# Patient Record
Sex: Female | Born: 1945
Health system: Southern US, Community
[De-identification: ages and names within clinical notes are randomized; demographics above are authoritative.]

## PROBLEM LIST (undated history)

## (undated) DIAGNOSIS — Z86010 Personal history of colonic polyps: Secondary | ICD-10-CM

## (undated) DIAGNOSIS — T7840XA Allergy, unspecified, initial encounter: Secondary | ICD-10-CM

## (undated) DIAGNOSIS — M7918 Myalgia, other site: Secondary | ICD-10-CM

## (undated) DIAGNOSIS — J300 Vasomotor rhinitis: Secondary | ICD-10-CM

## (undated) DIAGNOSIS — E669 Obesity, unspecified: Secondary | ICD-10-CM

## (undated) DIAGNOSIS — R0789 Other chest pain: Secondary | ICD-10-CM

## (undated) DIAGNOSIS — I10 Essential (primary) hypertension: Secondary | ICD-10-CM

## (undated) DIAGNOSIS — E785 Hyperlipidemia, unspecified: Secondary | ICD-10-CM

## (undated) HISTORY — DX: Vasomotor rhinitis: J30.0

## (undated) HISTORY — DX: Allergy, unspecified, initial encounter: T78.40XA

## (undated) HISTORY — DX: Other chest pain: R07.89

## (undated) HISTORY — DX: Hyperlipidemia, unspecified: E78.5

## (undated) HISTORY — DX: Essential (primary) hypertension: I10

## (undated) HISTORY — DX: Personal history of colonic polyps: Z86.010

## (undated) HISTORY — DX: Myalgia, other site: M79.18

## (undated) HISTORY — DX: Obesity, unspecified: E66.9

---

## 1992-08-20 HISTORY — PX: ABDOMINAL HYSTERECTOMY: SHX81

## 2000-02-02 ENCOUNTER — Other Ambulatory Visit: Admission: RE | Admit: 2000-02-02 | Discharge: 2000-02-02 | Payer: Self-pay | Admitting: *Deleted

## 2000-08-20 HISTORY — PX: CHOLECYSTECTOMY: SHX55

## 2000-08-20 HISTORY — PX: GANGLION CYST EXCISION: SHX1691

## 2000-12-30 ENCOUNTER — Other Ambulatory Visit: Admission: RE | Admit: 2000-12-30 | Discharge: 2000-12-30 | Payer: Self-pay | Admitting: *Deleted

## 2001-01-07 ENCOUNTER — Encounter: Payer: Self-pay | Admitting: *Deleted

## 2001-01-07 ENCOUNTER — Encounter: Admission: RE | Admit: 2001-01-07 | Discharge: 2001-01-07 | Payer: Self-pay | Admitting: *Deleted

## 2001-03-13 ENCOUNTER — Observation Stay (HOSPITAL_COMMUNITY): Admission: RE | Admit: 2001-03-13 | Discharge: 2001-03-13 | Payer: Self-pay | Admitting: General Surgery

## 2001-03-13 ENCOUNTER — Encounter (INDEPENDENT_AMBULATORY_CARE_PROVIDER_SITE_OTHER): Payer: Self-pay | Admitting: Specialist

## 2002-04-29 ENCOUNTER — Encounter: Payer: Self-pay | Admitting: Family Medicine

## 2002-04-29 ENCOUNTER — Encounter: Admission: RE | Admit: 2002-04-29 | Discharge: 2002-04-29 | Payer: Self-pay | Admitting: Family Medicine

## 2002-05-19 ENCOUNTER — Ambulatory Visit (HOSPITAL_COMMUNITY): Admission: RE | Admit: 2002-05-19 | Discharge: 2002-05-19 | Payer: Self-pay

## 2004-03-08 ENCOUNTER — Encounter: Admission: RE | Admit: 2004-03-08 | Discharge: 2004-03-08 | Payer: Self-pay | Admitting: Family Medicine

## 2007-08-21 DIAGNOSIS — Z860100 Personal history of colon polyps, unspecified: Secondary | ICD-10-CM

## 2007-08-21 HISTORY — DX: Personal history of colonic polyps: Z86.010

## 2007-08-21 HISTORY — DX: Personal history of colon polyps, unspecified: Z86.0100

## 2007-12-17 ENCOUNTER — Ambulatory Visit: Payer: Self-pay | Admitting: Internal Medicine

## 2008-01-20 ENCOUNTER — Ambulatory Visit: Payer: Self-pay | Admitting: Internal Medicine

## 2008-01-20 ENCOUNTER — Encounter: Payer: Self-pay | Admitting: Internal Medicine

## 2008-01-21 ENCOUNTER — Encounter: Payer: Self-pay | Admitting: Internal Medicine

## 2011-01-05 NOTE — Op Note (Signed)
[facility]  Patient:    Shelby Flores, Shelby Flores                           MRN: [id number]. Date: 03/13/01 Attending:  Sheppard Plumber. Earlene Plater, M.D. CC:         Heather Roberts, M.D.   Operative Report  PREOPERATIVE DIAGNOSIS:  Cholecystolithiasis.  POSTOPERATIVE DIAGNOSIS:  Cholecystolithiasis.  OPERATION:  Laparoscopic cholecystectomy.  SURGEON:  Timothy E. Earlene Plater, M.D.  ASSISTANT:  Rose Phi. Maple Hudson, M.D.  ANESTHESIA:  CRNA supervised by Dr. Rica Mast.  INDICATIONS:  [name] is healthy, 67, overweight, has had increasing problems with right upper quadrant pain, particularly at night.  Laboratory data are all normal except for cholesterol triglycerides, and ultrasound of the gallbladder shows a questions of stones, small, and multiple polyps.  She has been carefully counseled and wishes to proceed with this surgery.  DESCRIPTION OF PROCEDURE:  The patient was brought to the operating room and placed supine.  General endotracheal anesthesia administered.  The abdomen was scrubbed, prepped and draped in the usual fashion.  Marcaine 0.5% with epinephrine was used prior to each incision.  A vertical midline infraumbilical incision made, the fascia identified, opened vertically. Peritoneum entered without complication.  The Hasson catheter was placed and tied in place with a #1 Vicryl and the abdomen insufflated.  The camera introduced.  Peritoneoscopy revealed some adhesions in the lower midline but nothing pathologic.  A second 10 mm trocar placed in the mid epigastrium, two 5 mm trocars in the right upper quadrant.  The gallbladder was chronically inflamed, thickened, had adhesions of the omentum and duodenum.  The gallbladder was grasped and placed on tension, and the adhesions were taken down bluntly.  Careful dissection at the base of the gallbladder revealed a clearly defined cystic duct entering the ampulla of the gallbladder.  This was dissected around and  about for clear vision.  The cystic duct was triply clipped and divided.  Behind that, a small cystic artery was identified, isolated, and triply clipped.  The gallbladder was then removed from the gallbladder bed without incident or complication.  There was no bile leak and no bleeding.  Irrigation was carried out until clear.  The gallbladder was removed through the infraumbilical incision which was then tied shut.  The gallbladder was palpated and was thickened and did contain stones.  Copious irrigation was carried out.  It was clear.  All irrigants, CO2, trocars, and instruments were removed from the abdomen.  Each incision was examined, and then the two midline incisions were closed with interrupted 3-0 Monocryl.  Steri-Strips applied to all punctures.  Counts correct, dry sterile dressings applied, and she was removed to the recovery room in good condition. DD:  03/13/01 TD:  03/13/01 Job: [id number]

## 2011-09-28 ENCOUNTER — Other Ambulatory Visit: Payer: Self-pay | Admitting: Physician Assistant

## 2011-12-25 ENCOUNTER — Ambulatory Visit (INDEPENDENT_AMBULATORY_CARE_PROVIDER_SITE_OTHER): Payer: PRIVATE HEALTH INSURANCE | Admitting: Physician Assistant

## 2011-12-25 ENCOUNTER — Encounter: Payer: Self-pay | Admitting: Physician Assistant

## 2011-12-25 VITALS — BP 137/82 | HR 72 | Temp 98.6°F | Resp 16 | Ht 63.5 in | Wt 209.8 lb

## 2011-12-25 DIAGNOSIS — K219 Gastro-esophageal reflux disease without esophagitis: Secondary | ICD-10-CM

## 2011-12-25 DIAGNOSIS — E785 Hyperlipidemia, unspecified: Secondary | ICD-10-CM | POA: Insufficient documentation

## 2011-12-25 DIAGNOSIS — M7918 Myalgia, other site: Secondary | ICD-10-CM | POA: Insufficient documentation

## 2011-12-25 DIAGNOSIS — J300 Vasomotor rhinitis: Secondary | ICD-10-CM | POA: Insufficient documentation

## 2011-12-25 DIAGNOSIS — I10 Essential (primary) hypertension: Secondary | ICD-10-CM | POA: Insufficient documentation

## 2011-12-25 DIAGNOSIS — Z860100 Personal history of colon polyps, unspecified: Secondary | ICD-10-CM | POA: Insufficient documentation

## 2011-12-25 DIAGNOSIS — E669 Obesity, unspecified: Secondary | ICD-10-CM | POA: Insufficient documentation

## 2011-12-25 MED ORDER — BENAZEPRIL HCL 10 MG PO TABS: 10.0000 mg | ORAL_TABLET | Freq: Every day | ORAL | Status: DC

## 2011-12-25 MED ORDER — RANITIDINE HCL 150 MG PO TABS: 150.0000 mg | ORAL_TABLET | Freq: Two times a day (BID) | ORAL | Status: DC

## 2011-12-25 MED ORDER — SIMVASTATIN 40 MG PO TABS: 40.0000 mg | ORAL_TABLET | Freq: Every evening | ORAL | Status: DC

## 2011-12-25 NOTE — Patient Instructions (Signed)
Continue the meloxicam once or twice daily.  Do not supplement with Advil or Aleve.  Contact the oral surgeon you saw previously for a re-evaluation.

## 2011-12-25 NOTE — Progress Notes (Signed)
  Subjective:    Patient ID: Shelby Flores, female    DOB: 1945-10-27, 66 y.o.   MRN: [id number]  HPI  Presents for refills of medications and for evaluation of right sided upper abdominal pain.    She has a complete physical scheduled with me in a few weeks.  She describes the pain as burning, and is accompanied by increased belching.  Symptoms present x several months.  No nausea, vomiting.  No bowel changes.  No urinary changes.  Often present first thing in the morning when she awakens.  No identified link to meals.  Her facial pain continues.  She was followed in the faculty dental clinic at [facility], where the meloxicam was prescribed.  She has undergone nerve blocks and worn an oral appliance.  She has lost confidence in the provider she was seeing there. While she gets some benefit from the Meloxicam, she sometimes supplements it with OTC Aleve or Ibuprofen.  She is interested in seeking care with an oral surgeon she previously saw in [place] for a second opinion, who was recommended by the neurologist her mother sees for dystonia.  Review of Systems No chest pain, SOB, HA, dizziness, vision change, N/V, diarrhea, dysuria or rash.     Objective:   Physical Exam  Vital signs noted. Well-developed, well nourished WF who is awake, alert and oriented, in NAD. HEENT: [place]/AT, sclera and conjunctiva are clear.  EAC are patent, TMs are normal in appearance. Nasal mucosa is pink and moist. OP is clear. Neck: supple, non-tender, no lymphadenopathy, thyromegaly. Heart: RRR, no murmur Lungs: CTA Abdomen: normo-active bowel sounds, supple, no mass or organomegaly. Mild diffuse tenderness, worst in the RUQ. Skin: warm and dry without rash.       Assessment & Plan:   1. HTN (hypertension)  benazepril (LOTENSIN) 10 MG tablet  2. Hyperlipidemia  simvastatin (ZOCOR) 40 MG tablet  3. Reflux  ranitidine (ZANTAC) 150 MG tablet   She'll RTC in a few weeks for her CPE and we'll see how effective  the Ranitidine is.  She will stop supplemental NSAIDS, and advised to use acetaminophen if she needs something beyond the meloxicam.

## 2012-01-10 ENCOUNTER — Encounter: Payer: Self-pay | Admitting: Physician Assistant

## 2012-02-07 ENCOUNTER — Encounter: Payer: PRIVATE HEALTH INSURANCE | Admitting: Physician Assistant

## 2012-05-26 ENCOUNTER — Telehealth: Payer: Self-pay | Admitting: Radiology

## 2012-05-26 MED ORDER — MELOXICAM 7.5 MG PO TABS: 7.5000 mg | ORAL_TABLET | Freq: Every day | ORAL | Status: DC

## 2012-05-26 NOTE — Telephone Encounter (Signed)
rx and refills sent

## 2012-05-26 NOTE — Telephone Encounter (Signed)
LMOM RX sent in. 

## 2012-05-26 NOTE — Telephone Encounter (Signed)
Patient requesting Rx for Meloxicam 7.5 mg,she called pharmacy, they told her they  Have sent to [place], but we have not gotten,please advise on renewal [facility]

## 2012-08-21 ENCOUNTER — Encounter: Payer: Self-pay | Admitting: Internal Medicine

## 2012-08-25 ENCOUNTER — Other Ambulatory Visit: Payer: Self-pay | Admitting: Physician Assistant

## 2012-09-16 ENCOUNTER — Ambulatory Visit: Payer: PRIVATE HEALTH INSURANCE | Admitting: Family Medicine

## 2012-09-16 VITALS — BP 149/84 | HR 91 | Temp 98.6°F | Resp 17 | Ht 64.0 in | Wt 209.0 lb

## 2012-09-16 DIAGNOSIS — G500 Trigeminal neuralgia: Secondary | ICD-10-CM

## 2012-09-16 DIAGNOSIS — E785 Hyperlipidemia, unspecified: Secondary | ICD-10-CM

## 2012-09-16 DIAGNOSIS — Z23 Encounter for immunization: Secondary | ICD-10-CM

## 2012-09-16 MED ORDER — SIMVASTATIN 40 MG PO TABS: 40.0000 mg | ORAL_TABLET | Freq: Every evening | ORAL | Status: DC

## 2012-09-16 MED ORDER — BENAZEPRIL HCL 10 MG PO TABS: 10.0000 mg | ORAL_TABLET | Freq: Every day | ORAL | Status: DC

## 2012-09-16 MED ORDER — GABAPENTIN 300 MG PO CAPS: 300.0000 mg | ORAL_CAPSULE | Freq: Every day | ORAL | Status: DC

## 2012-09-16 NOTE — Progress Notes (Signed)
  Subjective:    Patient ID: Shelby Flores, female    DOB: [birth year], 67 y.o.   MRN: [id number]  HPI  Here for throbbing pain in the left ear for six weeks.  Has been shooting across the face.  Is going to a dental clinic so she was afraid to come in for ear pain.   Sometimes the pain will shoot down the jaw and occasionally it will shoot around the head.  Patient is taking Meloxicam for dental pain.  Mother has dystonia and has received botox injections for 20 years.  Patient has been wearing a splint at night for six weeks.  Will see Dr. Orlin Hilding in two weeks.  Thought it might be myofacial muscle pain.  Works for the [employer] as a Engineer, structural.    She has had chronic pain in pain.    Review of Systems     Objective:   Physical Exam HEENT:  Normal Neuro:  Normal CN      Assessment & Plan:   1. Trigeminal neuralgia  gabapentin (NEURONTIN) 300 MG capsule, benazepril (LOTENSIN) 10 MG tablet  2. Hyperlipidemia  simvastatin (ZOCOR) 40 MG tablet, Lipid panel, Comprehensive metabolic panel  3. Need for prophylactic vaccination and inoculation against influenza  Flu vaccine greater than or equal to 3yo preservative free IM

## 2012-09-16 NOTE — Patient Instructions (Signed)
Trigeminal Neuralgia Trigeminal neuralgia is a nerve disorder that causes sudden attacks of severe facial pain. It is caused by damage to the trigeminal nerve, a major nerve in the face. It is more common in women and in the elderly, although it can also happen in younger patients. Attacks last from a few seconds to several minutes and can occur from a couple of times per year to several times per day. Trigeminal neuralgia can be a very distressing and disabling condition. Surgery may be needed in very severe cases if medical treatment does not give relief. HOME CARE INSTRUCTIONS   If your caregiver prescribed medication to help prevent attacks, take as directed.  To help prevent attacks:  Chew on the unaffected side of the mouth.  Avoid touching your face.  Avoid blasts of hot or cold air.  Men may wish to grow a beard to avoid having to shave. SEEK IMMEDIATE MEDICAL CARE IF:  Pain is unbearable and your medicine does not help.  You develop new, unexplained symptoms (problems).  You have problems that may be related to a medication you are taking. Document Released: 08/03/2000 Document Revised: 10/29/2011 Document Reviewed: 06/03/2009 [employer] Patient Information 2013 [employer].  

## 2012-09-17 LAB — COMPREHENSIVE METABOLIC PANEL
ALT: 28 U/L (ref 0–35)
AST: 22 U/L (ref 0–37)
Albumin: 4.7 g/dL (ref 3.5–5.2)
Alkaline Phosphatase: 63 U/L (ref 39–117)
BUN: 12 mg/dL (ref 6–23)
CO2: 28 mEq/L (ref 19–32)
Calcium: 10.7 mg/dL — ABNORMAL HIGH (ref 8.4–10.5)
Chloride: 101 mEq/L (ref 96–112)
Creat: 0.69 mg/dL (ref 0.50–1.10)
Glucose, Bld: 103 mg/dL — ABNORMAL HIGH (ref 70–99)
Potassium: 4.5 mEq/L (ref 3.5–5.3)
Sodium: 136 mEq/L (ref 135–145)
Total Bilirubin: 1.3 mg/dL — ABNORMAL HIGH (ref 0.3–1.2)
Total Protein: 7.2 g/dL (ref 6.0–8.3)

## 2012-09-17 LAB — LIPID PANEL
Cholesterol: 193 mg/dL (ref 0–200)
HDL: 51 mg/dL (ref >39)
LDL Cholesterol: 107 mg/dL — ABNORMAL HIGH (ref 0–99)
Total CHOL/HDL Ratio: 3.8 Ratio
Triglycerides: 176 mg/dL — ABNORMAL HIGH (ref <150)
VLDL: 35 mg/dL (ref 0–40)

## 2013-04-07 DIAGNOSIS — Z0271 Encounter for disability determination: Secondary | ICD-10-CM

## 2013-09-25 ENCOUNTER — Encounter: Payer: Self-pay | Admitting: Internal Medicine

## 2014-01-18 ENCOUNTER — Ambulatory Visit: Payer: PRIVATE HEALTH INSURANCE | Admitting: Emergency Medicine

## 2014-01-18 VITALS — BP 110/68 | HR 71 | Temp 98.6°F | Resp 17 | Wt 210.6 lb

## 2014-01-18 DIAGNOSIS — Z23 Encounter for immunization: Secondary | ICD-10-CM

## 2014-01-18 DIAGNOSIS — S61409A Unspecified open wound of unspecified hand, initial encounter: Secondary | ICD-10-CM

## 2014-01-18 DIAGNOSIS — S61419A Laceration without foreign body of unspecified hand, initial encounter: Secondary | ICD-10-CM

## 2014-01-18 MED ORDER — SULFAMETHOXAZOLE-TMP DS 800-160 MG PO TABS: 1.0000 | ORAL_TABLET | Freq: Two times a day (BID) | ORAL | Status: DC

## 2014-01-18 NOTE — Progress Notes (Signed)
[facility] [address] [place] [address] [phone]  Date:  01/18/2014   Name:  Shelby Flores   DOB:  [birth year]   MRN:  [id number]  PCP:  No PCP Per Patient    Chief Complaint: Hand Injury   History of Present Illness:  Shelby Flores is a 68 y.o. very pleasant female patient who presents with the following:  Stabbed herself with a pruning shear yesterday.  Has a laceration of the palm of the right hand.  Uncertain of last TD.  Denies other complaint or health concern today..  Patient Active Problem List   Diagnosis Date Noted  . Vasomotor rhinitis   . HTN (hypertension)   . Hyperlipidemia   . Obesity   . Myofacial muscle pain   . Personal history of colonic polyps     Past Medical History  Diagnosis Date  . Vasomotor rhinitis   . HTN (hypertension)   . Hyperlipidemia   . Obesity   . Myofacial muscle pain     FACIAL  . Personal history of colonic polyps 2009    Repeat colonoscopy 2014    Past Surgical History  Procedure Laterality Date  . Cholecystectomy  2002  . Abdominal hysterectomy  1994    PARTIAL-fibroids    History  Substance Use Topics  . Smoking status: Never Smoker   . Smokeless tobacco: Not on file  . Alcohol Use: 2.0 oz/week    4 drink(s) per week     Comment: wine    Family History  Problem Relation Age of Onset  . Dystonia Mother     masseter  . Hypertension Father   . Atrial fibrillation Father     pacemaker  . Cancer Maternal Grandmother     colon    No Known Allergies  Medication list has been reviewed and updated.  Current Outpatient Prescriptions on File Prior to Visit  Medication Sig Dispense Refill  . benazepril (LOTENSIN) 10 MG tablet Take 1 tablet (10 mg total) by mouth daily. Need office visit for additional refills.  90 tablet  3  . cetirizine (ZYRTEC) 10 MG tablet Take 10 mg by mouth daily.      . fluticasone (FLONASE) 50 MCG/ACT nasal spray Place 2 sprays into the nose daily.      .  simvastatin (ZOCOR) 40 MG tablet Take 1 tablet (40 mg total) by mouth every evening.  90 tablet  1  . ranitidine (ZANTAC) 150 MG tablet Take 1 tablet (150 mg total) by mouth 2 (two) times daily.  60 tablet  0   No current facility-administered medications on file prior to visit.    Review of Systems:  As per HPI, otherwise negative.    Physical Examination: Filed Vitals:   01/18/14 1358  BP: 110/68  Pulse: 71  Temp: 98.6 F (37 C)  Resp: 17   Filed Vitals:   01/18/14 1358  Weight: 210 lb 9.6 oz (95.528 kg)   Body mass index is 36.13 kg/(m^2). Ideal Body Weight:     GEN: WDWN, NAD, Non-toxic, Alert & Oriented x 3 HEENT: Atraumatic, Normocephalic.  Ears and Nose: No external deformity. EXTR: No clubbing/cyanosis/edema NEURO: Normal gait.  PSYCH: Normally interactive. Conversant. Not depressed or anxious appearing.  Calm demeanor.  Right hand:  Laceration palm of right hand at base of thumb.  NATI.  No FB.  No erythema or tenderness.    Assessment and Plan: Laceration hand   Signed,  Jacqulynn Cadet  Ouida Sills, MD

## 2014-01-18 NOTE — Patient Instructions (Signed)

## 2014-03-12 ENCOUNTER — Other Ambulatory Visit: Payer: Self-pay | Admitting: Family Medicine

## 2014-03-16 ENCOUNTER — Other Ambulatory Visit: Payer: Self-pay | Admitting: Family Medicine

## 2014-03-23 ENCOUNTER — Telehealth: Payer: Self-pay

## 2014-03-23 DIAGNOSIS — G500 Trigeminal neuralgia: Secondary | ICD-10-CM

## 2014-03-23 DIAGNOSIS — E785 Hyperlipidemia, unspecified: Secondary | ICD-10-CM

## 2014-03-23 NOTE — Telephone Encounter (Signed)
Pt called needing to get refills on the following meds.   Pt forgot to ask for a prescription on 01/18/2014.  benazepril (LOTENSIN) simvastatin (ZOCOR)    PT # [phone]

## 2014-03-24 MED ORDER — SIMVASTATIN 40 MG PO TABS: 40.0000 mg | ORAL_TABLET | Freq: Every evening | ORAL | Status: DC

## 2014-03-24 MED ORDER — BENAZEPRIL HCL 10 MG PO TABS: 10.0000 mg | ORAL_TABLET | Freq: Every day | ORAL | Status: DC

## 2014-03-24 NOTE — Telephone Encounter (Signed)
Spoke to pt- will send in 30 day supply Sent to billing to make an appt.

## 2014-10-18 ENCOUNTER — Ambulatory Visit (INDEPENDENT_AMBULATORY_CARE_PROVIDER_SITE_OTHER): Payer: PRIVATE HEALTH INSURANCE | Admitting: Family Medicine

## 2014-10-18 ENCOUNTER — Encounter: Payer: Self-pay | Admitting: Family Medicine

## 2014-10-18 VITALS — BP 146/88 | HR 78 | Temp 98.3°F | Resp 16 | Ht 63.75 in | Wt 213.0 lb

## 2014-10-18 DIAGNOSIS — Z130 Encounter for screening for diseases of the blood and blood-forming organs and certain disorders involving the immune mechanism: Secondary | ICD-10-CM | POA: Diagnosis not present

## 2014-10-18 DIAGNOSIS — R635 Abnormal weight gain: Secondary | ICD-10-CM

## 2014-10-18 DIAGNOSIS — G250 Essential tremor: Secondary | ICD-10-CM | POA: Diagnosis not present

## 2014-10-18 DIAGNOSIS — Z23 Encounter for immunization: Secondary | ICD-10-CM

## 2014-10-18 DIAGNOSIS — Z0000 Encounter for general adult medical examination without abnormal findings: Secondary | ICD-10-CM

## 2014-10-18 DIAGNOSIS — E785 Hyperlipidemia, unspecified: Secondary | ICD-10-CM

## 2014-10-18 DIAGNOSIS — I10 Essential (primary) hypertension: Secondary | ICD-10-CM

## 2014-10-18 DIAGNOSIS — M797 Fibromyalgia: Secondary | ICD-10-CM

## 2014-10-18 DIAGNOSIS — Z114 Encounter for screening for human immunodeficiency virus [HIV]: Secondary | ICD-10-CM | POA: Diagnosis not present

## 2014-10-18 DIAGNOSIS — Z1159 Encounter for screening for other viral diseases: Secondary | ICD-10-CM

## 2014-10-18 DIAGNOSIS — M7918 Myalgia, other site: Secondary | ICD-10-CM

## 2014-10-18 LAB — COMPREHENSIVE METABOLIC PANEL
ALT: 22 U/L (ref 0–35)
AST: 19 U/L (ref 0–37)
Albumin: 3.9 g/dL (ref 3.5–5.2)
Alkaline Phosphatase: 63 U/L (ref 39–117)
BILIRUBIN TOTAL: 1.1 mg/dL (ref 0.2–1.2)
BUN: 13 mg/dL (ref 6–23)
CO2: 27 mEq/L (ref 19–32)
Calcium: 10.1 mg/dL (ref 8.4–10.5)
Chloride: 104 mEq/L (ref 96–112)
Creat: 0.73 mg/dL (ref 0.50–1.10)
GLUCOSE: 101 mg/dL — AB (ref 70–99)
Potassium: 4.9 mEq/L (ref 3.5–5.3)
Sodium: 137 mEq/L (ref 135–145)
Total Protein: 6.8 g/dL (ref 6.0–8.3)

## 2014-10-18 LAB — CBC
HEMATOCRIT: 42.7 % (ref 36.0–46.0)
Hemoglobin: 14.3 g/dL (ref 12.0–15.0)
MCH: 29.2 pg (ref 26.0–34.0)
MCHC: 33.5 g/dL (ref 30.0–36.0)
MCV: 87.1 fL (ref 78.0–100.0)
MPV: 9.1 fL (ref 8.6–12.4)
Platelets: 350 10*3/uL (ref 150–400)
RBC: 4.9 MIL/uL (ref 3.87–5.11)
RDW: 13 % (ref 11.5–15.5)
WBC: 6.8 10*3/uL (ref 4.0–10.5)

## 2014-10-18 LAB — LIPID PANEL
CHOLESTEROL: 229 mg/dL — AB (ref 0–200)
HDL: 46 mg/dL (ref >=46)
LDL CALC: 135 mg/dL — AB (ref 0–99)
TRIGLYCERIDES: 241 mg/dL — AB (ref <150)
Total CHOL/HDL Ratio: 5 Ratio
VLDL: 48 mg/dL — ABNORMAL HIGH (ref 0–40)

## 2014-10-18 LAB — TSH: TSH: 0.784 u[IU]/mL (ref 0.350–4.500)

## 2014-10-18 LAB — HEPATITIS C ANTIBODY: HCV AB: NEGATIVE

## 2014-10-18 LAB — HIV ANTIBODY (ROUTINE TESTING W REFLEX): HIV: NONREACTIVE

## 2014-10-18 MED ORDER — ZOSTER VACCINE LIVE 19400 UNT/0.65ML ~~LOC~~ SOLR: 0.6500 mL | Freq: Once | SUBCUTANEOUS | Status: DC

## 2014-10-18 MED ORDER — NAPROXEN SODIUM 550 MG PO TABS: 550.0000 mg | ORAL_TABLET | Freq: Two times a day (BID) | ORAL | Status: DC

## 2014-10-18 MED ORDER — BENAZEPRIL HCL 10 MG PO TABS: 10.0000 mg | ORAL_TABLET | Freq: Every day | ORAL | Status: DC

## 2014-10-18 MED ORDER — SIMVASTATIN 40 MG PO TABS: 40.0000 mg | ORAL_TABLET | Freq: Every evening | ORAL | Status: DC

## 2014-10-18 NOTE — Patient Instructions (Addendum)
Call Dr. Henrene Pastor and see if you are due for a colonoscopy  Address: [address] [place] [address], [place], [place] [address] [phone]  I will be in touch with your labs asap by mail  Get your shingles vaccine at your convenience however wait a month following today's pneumonia vaccine  Continue to work on exercise!  You can use the naproxen as needed for pain- however on days when you have less pain tylenol may be a better choice.  Please see me in about 6 months for a follow-up visit

## 2014-10-18 NOTE — Progress Notes (Signed)
[facility] [address], [place] [address] [phone]  Date:  10/18/2014   Name:  Shelby Flores   DOB:  1946-06-07   MRN:  [id number]  PCP:  No PCP Per Patient    Chief Complaint: Annual Exam   History of Present Illness:  Shelby Flores is a 69 y.o. very pleasant female patient who presents with the following:  Here today for a CPE.  She is fasting this am except for a glass of emergen-C which she does not think has any sugar in it.   History of hysterectomy in 1994; this was done due to uterine fibroids.  She thinks that her cervix was removed. She never had any GYN cancer and has had a few paps since her operation which were all normal  She had been on lotensin, simvastatin- ran out a few months ago.  Would like to go back on these  She takes naproxen for her facial myofacial pain. She has had this issue for years, cause is unknown.  She plans to have botox for this condition at some time in the future.    She had a colonoscopy per Dr. Henrene Pastor- she thinks that she is coming due for follow-up soon  Married, one adopted 77 year old son, works as a Cytogeneticist.  She drinks wine socially  BP Readings from Last 3 Encounters:  10/18/14 146/88  01/18/14 110/68  09/16/12 149/84     Patient Active Problem List   Diagnosis Date Noted  . Vasomotor rhinitis   . HTN (hypertension)   . Hyperlipidemia   . Obesity   . Myofacial muscle pain   . Personal history of colonic polyps     Past Medical History  Diagnosis Date  . Vasomotor rhinitis   . HTN (hypertension)   . Hyperlipidemia   . Obesity   . Myofacial muscle pain     FACIAL  . Personal history of colonic polyps 2009    Repeat colonoscopy 2014    Past Surgical History  Procedure Laterality Date  . Cholecystectomy  2002  . Abdominal hysterectomy  1994    PARTIAL-fibroids    History  Substance Use Topics  . Smoking status: Never Smoker   . Smokeless tobacco: Not on file  . Alcohol Use:  2.0 oz/week    4 drink(s) per week     Comment: wine    Family History  Problem Relation Age of Onset  . Dystonia Mother     masseter  . Hypertension Father   . Atrial fibrillation Father     pacemaker  . Cancer Maternal Grandmother     colon    No Known Allergies  Medication list has been reviewed and updated.  Current Outpatient Prescriptions on File Prior to Visit  Medication Sig Dispense Refill  . benazepril (LOTENSIN) 10 MG tablet Take 1 tablet (10 mg total) by mouth daily. Need office visit for additional refills. (Patient not taking: Reported on 10/18/2014) 30 tablet 0  . cetirizine (ZYRTEC) 10 MG tablet Take 10 mg by mouth daily.    . fluticasone (FLONASE) 50 MCG/ACT nasal spray Place 2 sprays into the nose daily.    . ranitidine (ZANTAC) 150 MG tablet Take 1 tablet (150 mg total) by mouth 2 (two) times daily. 60 tablet 0  . simvastatin (ZOCOR) 40 MG tablet Take 1 tablet (40 mg total) by mouth every evening. (Patient not taking: Reported on 10/18/2014) 30 tablet 0  . sulfamethoxazole-trimethoprim (  BACTRIM DS) 800-160 MG per tablet Take 1 tablet by mouth 2 (two) times daily. (Patient not taking: Reported on 10/18/2014) 20 tablet 0   No current facility-administered medications on file prior to visit.    Review of Systems:  As per HPI- otherwise negative.   Physical Examination: Filed Vitals:   10/18/14 0830  BP: 146/88  Pulse: 78  Temp: 98.3 F (36.8 C)  Resp: 16   Filed Vitals:   10/18/14 0830  Height: 5' 3.75" (1.619 m)  Weight: 213 lb (96.616 kg)   Body mass index is 36.86 kg/(m^2). Ideal Body Weight: Weight in (lb) to have BMI = 25: 144.2  GEN: WDWN, NAD, Non-toxic, A & O x 3, obese, looks well HEENT: Atraumatic, Normocephalic. Neck supple. No masses, No LAD.  Bilateral TM wnl, oropharynx normal.  PEERL,EOMI.   Ears and Nose: No external deformity. CV: RRR, No M/G/R. No JVD. No thrill. No extra heart sounds. PULM: CTA B, no wheezes, crackles, rhonchi.  No retractions. No resp. distress. No accessory muscle use. ABD: S, NT, ND, +BS. No rebound. No HSM. EXTR: No c/c/e NEURO Normal gait.  PSYCH: Normally interactive. Conversant. Not depressed or anxious appearing.  Calm demeanor.  Breast: normal exam, no masses/ dimpling/ discharge Pelvic: normal, no vaginal lesions or discharge. Uterus and cervix surgically absent, no adnexal tendereness or masses  Assessment and Plan: Physical exam  Need for shingles vaccine - Plan: zoster vaccine live, PF, (ZOSTAVAX) 09407 UNT/0.65ML injection  Immunization due - Plan: Pneumococcal conjugate vaccine 13-valent IM  Screening for HIV (human immunodeficiency virus) - Plan: HIV antibody  Need for hepatitis C screening test - Plan: Hepatitis C antibody  Hyperlipidemia - Plan: Lipid panel, simvastatin (ZOCOR) 40 MG tablet  Essential hypertension - Plan: Comprehensive metabolic panel, benazepril (LOTENSIN) 10 MG tablet  Weight gain - Plan: TSH  Screening for deficiency anemia - Plan: CBC  Myofacial muscle pain - Plan: naproxen sodium (ANAPROX) 550 MG tablet  Restart her medications for HTN and high cholesterol, refilled her prn naproxen.  No need for pap Screening tests as above Encouraged her to continue working on her diet and exercise for weight loss prevnar today   Signed Lamar Blinks, MD

## 2014-10-25 ENCOUNTER — Ambulatory Visit: Payer: PRIVATE HEALTH INSURANCE | Admitting: Family Medicine

## 2015-02-22 ENCOUNTER — Ambulatory Visit (INDEPENDENT_AMBULATORY_CARE_PROVIDER_SITE_OTHER): Payer: PRIVATE HEALTH INSURANCE | Admitting: Family Medicine

## 2015-02-22 VITALS — BP 130/80 | HR 71 | Temp 98.6°F | Resp 18 | Ht 63.75 in | Wt 213.6 lb

## 2015-02-22 DIAGNOSIS — L03113 Cellulitis of right upper limb: Secondary | ICD-10-CM

## 2015-02-22 DIAGNOSIS — S61451A Open bite of right hand, initial encounter: Secondary | ICD-10-CM

## 2015-02-22 DIAGNOSIS — W5501XA Bitten by cat, initial encounter: Secondary | ICD-10-CM

## 2015-02-22 MED ORDER — AMOXICILLIN-POT CLAVULANATE 875-125 MG PO TABS: 1.0000 | ORAL_TABLET | Freq: Two times a day (BID) | ORAL | Status: DC

## 2015-02-22 NOTE — Progress Notes (Signed)
Subjective:  Patient ID: Shelby Flores, female    DOB: 10-Feb-1946  Age: 69 y.o. MRN: [id number]  69 year old female who picked up her cat and was bit on the right hand yesterday. Today there is redness and a red streak going up the arm and it is more painful it is her own cat, and has been fully immunized. He Lives indoors and outdoors   Objective:   Erythematous right hand at the base of the right thumb metacarpal. There is surrounding erythema about a 2 cm radius with an ace ascending redness going up the arm about 10 cm. Not badly swollen. Moves her hand well. No axillary nodes or tenderness  Assessment & Plan:   Assessment: Cat bite with cellulitis, probably Pasteurella  Plan: Treat with Augmentin Patient Instructions  Take Augmentin (amoxicillin/clavulanate) one pill twice daily. Try to get the initial 2 pills in today. May cause loose stools.  Tylenol 1000 mg 3 times daily or ibuprofen 600-800 mg 3 times daily as needed for pain or inflammation  In the event of generalized illness such as fever and chills or markedly worsening wound return or go to the emergency room  Animal Bite An animal bite can result in a scratch on the skin, deep open cut, puncture of the skin, crush injury, or tearing away of the skin or a body part. Dogs are responsible for most animal bites. Children are bitten more often than adults. An animal bite can range from very mild to more serious. A small bite from your house pet is no cause for alarm. However, some animal bites can become infected or injure a bone or other tissue. You must seek medical care if:  The skin is broken and bleeding does not slow down or stop after 15 minutes.  The puncture is deep and difficult to clean (such as a cat bite).  Pain, warmth, redness, or pus develops around the wound.  The bite is from a stray animal or rodent. There may be a risk of rabies infection.  The bite is from a snake, raccoon, skunk, fox, coyote, or bat.  There may be a risk of rabies infection.  The person bitten has a chronic illness such as diabetes, liver disease, or cancer, or the person takes medicine that lowers the immune system.  There is concern about the location and severity of the bite. It is important to clean and protect an animal bite wound right away to prevent infection. Follow these steps:  Clean the wound with plenty of water and soap.  Apply an antibiotic cream.  Apply gentle pressure over the wound with a clean towel or gauze to slow or stop bleeding.  Elevate the affected area above the heart to help stop any bleeding.  Seek medical care. Getting medical care within 8 hours of the animal bite leads to the best possible outcome. DIAGNOSIS  Your caregiver will most likely:  Take a detailed history of the animal and the bite injury.  Perform a wound exam.  Take your medical history. Blood tests or X-rays may be performed. Sometimes, infected bite wounds are cultured and sent to a lab to identify the infectious bacteria.  TREATMENT  Medical treatment will depend on the location and type of animal bite as well as the patient's medical history. Treatment may include:  Wound care, such as cleaning and flushing the wound with saline solution, bandaging, and elevating the affected area.  Antibiotics.  Tetanus immunization.  Rabies immunization.  Leaving the wound  open to heal. This is often done with animal bites, due to the high risk of infection. However, in certain cases, wound closure with stitches, wound adhesive, skin adhesive strips, or staples may be used. Infected bites that are left untreated may require intravenous (IV) antibiotics and surgical treatment in the hospital. [place]  Follow your caregiver's instructions for wound care.  Take all medicines as directed.  If your caregiver prescribes antibiotics, take them as directed. Finish them even if you start to feel better.  Follow  up with your caregiver for further exams or immunizations as directed. You may need a tetanus shot if:  You cannot remember when you had your last tetanus shot.  You have never had a tetanus shot.  The injury broke your skin. If you get a tetanus shot, your arm may swell, get red, and feel warm to the touch. This is common and not a problem. If you need a tetanus shot and you choose not to have one, there is a rare chance of getting tetanus. Sickness from tetanus can be serious. SEEK MEDICAL CARE IF:  You notice warmth, redness, soreness, swelling, pus discharge, or a bad smell coming from the wound.  You have a red line on the skin coming from the wound.  You have a fever, chills, or a general ill feeling.  You have nausea or vomiting.  You have continued or worsening pain.  You have trouble moving the injured part.  You have other questions or concerns. MAKE SURE YOU:  Understand these instructions.  Will watch your condition.  Will get help right away if you are not doing well or get worse. Document Released: 04/24/2011 Document Revised: 10/29/2011 Document Reviewed: 04/24/2011 [facility] Patient Information 2015 [place], [place]. This information is not intended to replace advice given to you by your health care provider. Make sure you discuss any questions you have with your health care provider.      Eudelia Hiltunen, MD 02/22/2015

## 2015-02-22 NOTE — Patient Instructions (Addendum)
Take Augmentin (amoxicillin/clavulanate) one pill twice daily. Try to get the initial 2 pills in today. May cause loose stools.  Tylenol 1000 mg 3 times daily or ibuprofen 600-800 mg 3 times daily as needed for pain or inflammation  In the event of generalized illness such as fever and chills or markedly worsening wound return or go to the emergency room  Animal Bite An animal bite can result in a scratch on the skin, deep open cut, puncture of the skin, crush injury, or tearing away of the skin or a body part. Dogs are responsible for most animal bites. Children are bitten more often than adults. An animal bite can range from very mild to more serious. A small bite from your house pet is no cause for alarm. However, some animal bites can become infected or injure a bone or other tissue. You must seek medical care if:  The skin is broken and bleeding does not slow down or stop after 15 minutes.  The puncture is deep and difficult to clean (such as a cat bite).  Pain, warmth, redness, or pus develops around the wound.  The bite is from a stray animal or rodent. There may be a risk of rabies infection.  The bite is from a snake, raccoon, skunk, fox, coyote, or bat. There may be a risk of rabies infection.  The person bitten has a chronic illness such as diabetes, liver disease, or cancer, or the person takes medicine that lowers the immune system.  There is concern about the location and severity of the bite. It is important to clean and protect an animal bite wound right away to prevent infection. Follow these steps:  Clean the wound with plenty of water and soap.  Apply an antibiotic cream.  Apply gentle pressure over the wound with a clean towel or gauze to slow or stop bleeding.  Elevate the affected area above the heart to help stop any bleeding.  Seek medical care. Getting medical care within 8 hours of the animal bite leads to the best possible outcome. DIAGNOSIS  Your  caregiver will most likely:  Take a detailed history of the animal and the bite injury.  Perform a wound exam.  Take your medical history. Blood tests or X-rays may be performed. Sometimes, infected bite wounds are cultured and sent to a lab to identify the infectious bacteria.  TREATMENT  Medical treatment will depend on the location and type of animal bite as well as the patient's medical history. Treatment may include:  Wound care, such as cleaning and flushing the wound with saline solution, bandaging, and elevating the affected area.  Antibiotics.  Tetanus immunization.  Rabies immunization.  Leaving the wound open to heal. This is often done with animal bites, due to the high risk of infection. However, in certain cases, wound closure with stitches, wound adhesive, skin adhesive strips, or staples may be used. Infected bites that are left untreated may require intravenous (IV) antibiotics and surgical treatment in the hospital. [place]  Follow your caregiver's instructions for wound care.  Take all medicines as directed.  If your caregiver prescribes antibiotics, take them as directed. Finish them even if you start to feel better.  Follow up with your caregiver for further exams or immunizations as directed. You may need a tetanus shot if:  You cannot remember when you had your last tetanus shot.  You have never had a tetanus shot.  The injury broke your skin. If you get a  tetanus shot, your arm may swell, get red, and feel warm to the touch. This is common and not a problem. If you need a tetanus shot and you choose not to have one, there is a rare chance of getting tetanus. Sickness from tetanus can be serious. SEEK MEDICAL CARE IF:  You notice warmth, redness, soreness, swelling, pus discharge, or a bad smell coming from the wound.  You have a red line on the skin coming from the wound.  You have a fever, chills, or a general ill feeling.  You  have nausea or vomiting.  You have continued or worsening pain.  You have trouble moving the injured part.  You have other questions or concerns. MAKE SURE YOU:  Understand these instructions.  Will watch your condition.  Will get help right away if you are not doing well or get worse. Document Released: 04/24/2011 Document Revised: 10/29/2011 Document Reviewed: 04/24/2011 [facility] Patient Information 2015 [place], [place]. This information is not intended to replace advice given to you by your health care provider. Make sure you discuss any questions you have with your health care provider.

## 2015-06-22 ENCOUNTER — Encounter: Payer: Self-pay | Admitting: Family Medicine

## 2015-06-22 ENCOUNTER — Ambulatory Visit (INDEPENDENT_AMBULATORY_CARE_PROVIDER_SITE_OTHER): Payer: PRIVATE HEALTH INSURANCE | Admitting: Family Medicine

## 2015-06-22 VITALS — BP 116/80 | HR 81 | Temp 98.4°F | Resp 16 | Ht 63.75 in | Wt 208.8 lb

## 2015-06-22 DIAGNOSIS — R0602 Shortness of breath: Secondary | ICD-10-CM | POA: Diagnosis not present

## 2015-06-22 DIAGNOSIS — G8929 Other chronic pain: Secondary | ICD-10-CM

## 2015-06-22 DIAGNOSIS — F40243 Fear of flying: Secondary | ICD-10-CM | POA: Diagnosis not present

## 2015-06-22 DIAGNOSIS — J3489 Other specified disorders of nose and nasal sinuses: Secondary | ICD-10-CM

## 2015-06-22 DIAGNOSIS — R0982 Postnasal drip: Secondary | ICD-10-CM

## 2015-06-22 DIAGNOSIS — R1010 Upper abdominal pain, unspecified: Secondary | ICD-10-CM

## 2015-06-22 DIAGNOSIS — Z23 Encounter for immunization: Secondary | ICD-10-CM | POA: Diagnosis not present

## 2015-06-22 DIAGNOSIS — R1011 Right upper quadrant pain: Secondary | ICD-10-CM

## 2015-06-22 MED ORDER — IPRATROPIUM BROMIDE 0.03 % NA SOLN: 2.0000 | Freq: Four times a day (QID) | NASAL | Status: DC

## 2015-06-22 MED ORDER — FLUCONAZOLE 150 MG PO TABS: ORAL_TABLET | ORAL | Status: DC

## 2015-06-22 MED ORDER — ALPRAZOLAM 0.25 MG PO TABS: ORAL_TABLET | ORAL | Status: DC

## 2015-06-22 NOTE — Patient Instructions (Signed)
You can use the xanax as needed for flying- remember do not drive if you feel drowsy after using it We can try an antifungal (diflucan) for your sinuses.  Take 1, then repeat 3 days later.   Hold (do not take) your cholesterol med while you are taking the diflucan- once you finish you can go back on it Try the atrovent nasal spray for your nasal drainage- it is ok to use your steroid nasal spray as well Let me know if your sinus symptoms do not go away I will set you up to see cardiology to make sure your shortness of breath is not anything of concern-

## 2015-06-22 NOTE — Progress Notes (Addendum)
[facility] [address] [place] [address] [phone]  Date:  06/22/2015   Name:  Shelby Flores   DOB:  05/01/1946   MRN:  [id number]  PCP:  No PCP Per Patient    Chief Complaint: left earache; drainge back of throat, "foul smell"; and would like med for flying, leaving next wk   History of Present Illness:  Shelby Flores is a 69 y.o. very pleasant female patient who presents with the following:  Here today with illness- she has noted a recurrent/ persistent problem with her left ear [facility] by mouth for one week. She did seem to get better, but then the pains returned.  She went back and got (she thinks) augmentin.  She finished up the course of augmentin about a month ago.  She never really got better after taking the augmentin  She notes that her eyes are cloudy and runny, and sometimes she is able to taste some foul post- nasal discarge.  She has had a "yeast infection in my sinuses" in the past and this reminds her of that time.  Apparently one of her docs cultured some material from her sinuses and it grew yeast. She was treated and got better  She has not noted a fever, but she does have some cough.  She notes PND  No history of glaucoma No vision change  She also has had chronic jaw pain "for years"- it has bothered her for at least a decade She uses naproxen, other OTC NSAIDs for this.  It gets worse with stress and she has felt stressed as of late.   Her DDS does not think this is TMJ.  She has seen oral surgery and several other doctors but no one has been able to give her a firm dx or her pain She wears a splint of some sort from her dentist as needed  She tried a muscle relaxer but it did not seem to help. She has also seen the [facility] dental program but they did not have a lot of offer her She would like to do botox but they are not sure if this may help and is quite expensive  She  also notes right upper quadrant tenderness- this comes and goes, she has noticed it more frequently over the last 5 months or so, can be associated with eating.  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  She did try a xanax from a friend that did help her.  Would like to have some for an upcoming trip  She gets a bit concerned about her heart-  She notes that her father had an arrythmia and CHF.  He lived to the age of 25.  She has noted that she is able to walk, etc without any trouble but that she does feel SOB sometimes with yard work.  No CP however  Patient Active Problem List   Diagnosis Date Noted  . Essential tremor 10/18/2014  . Vasomotor rhinitis   . HTN (hypertension)   . Hyperlipidemia   . Obesity   . Myofacial muscle pain   . Personal history of colonic polyps     Past Medical History  Diagnosis Date  . Vasomotor rhinitis   . HTN (hypertension)   . Hyperlipidemia   . Obesity   . Myofacial muscle  pain     FACIAL  . Personal history of colonic polyps 2009    Repeat colonoscopy 2014    Past Surgical History  Procedure Laterality Date  . Cholecystectomy  2002  . Abdominal hysterectomy  1994    PARTIAL-fibroids    Social History  Substance Use Topics  . Smoking status: Never Smoker   . Smokeless tobacco: None  . Alcohol Use: 2.0 oz/week    4 drink(s) per week     Comment: wine    Family History  Problem Relation Age of Onset  . Dystonia Mother     masseter  . Hypertension Father   . Atrial fibrillation Father     pacemaker  . Cancer Maternal Grandmother     colon    No Known Allergies  Medication list has been reviewed and updated.  Current Outpatient Prescriptions on File Prior to Visit  Medication Sig Dispense Refill  . benazepril (LOTENSIN) 10 MG tablet Take 1 tablet (10 mg total) by mouth daily. 90 tablet 3  . cetirizine (ZYRTEC) 10 MG tablet Take 10 mg by mouth daily.    .  fluticasone (FLONASE) 50 MCG/ACT nasal spray Place 2 sprays into the nose daily.    . naproxen sodium (ANAPROX) 550 MG tablet Take 1 tablet (550 mg total) by mouth 2 (two) times daily with a meal. Use as needed for pain 90 tablet 3  . simvastatin (ZOCOR) 40 MG tablet Take 1 tablet (40 mg total) by mouth every evening. 90 tablet 3  . ranitidine (ZANTAC) 150 MG tablet Take 1 tablet (150 mg total) by mouth 2 (two) times daily. 60 tablet 0  . zoster vaccine live, PF, (ZOSTAVAX) 32671 UNT/0.65ML injection Inject 19,400 Units into the skin once. (Patient not taking: Reported on 06/22/2015) 1 each 0   No current facility-administered medications on file prior to visit.    Review of Systems:  As per HPI- otherwise negative.   Physical Examination: Filed Vitals:   06/22/15 1623  BP: 116/80  Pulse: 81  Temp: 98.4 F (36.9 C)  Resp: 16   Filed Vitals:   06/22/15 1623  Height: 5' 3.75" (1.619 m)  Weight: 208 lb 12.8 oz (94.711 kg)   Body mass index is 36.13 kg/(m^2). Ideal Body Weight: Weight in (lb) to have BMI = 25: 144.2  GEN: WDWN, NAD, Non-toxic, A & O x 3, obese, looks well HEENT: Atraumatic, Normocephalic. Neck supple. No masses, No LAD.  Bilateral TM wnl, oropharynx normal.  PEERL,EOMI.   Ears and Nose: No external deformity. CV: RRR, No M/G/R. No JVD. No thrill. No extra heart sounds. PULM: CTA B, no wheezes, crackles, rhonchi. No retractions. No resp. distress. No accessory muscle use. ABD: S, NT, ND EXTR: No c/c/e NEURO Normal gait.  PSYCH: Normally interactive. Conversant. Not depressed or anxious appearing.  Calm demeanor.    Assessment and Plan: SOB (shortness of breath) - Plan: Ambulatory referral to Cardiology  Abdominal pain, chronic, right upper quadrant - Plan: CBC, Comprehensive metabolic panel  Fear of flying - Plan: ALPRAZolam (XANAX) 0.25 MG tablet  Post-nasal drip - Plan: ipratropium (ATROVENT) 0.03 % nasal spray  Sinus pain - Plan: fluconazole (DIFLUCAN)  150 MG tablet  Immunization due - Plan: Flu Vaccine QUAD 36+ mos IM  rx for xanax for fear of flying- cautioned that this can be habit forming and sedating She is concerned about CAD- referral to cardiology for eval Try atrovent for her PND.  Also she would like to try  treating her sinus infection like it might be fungal- while this does not seem very likely diflucan should not cause her any harm so we will try it.   See patient instructions for more details.    Signed Lamar Blinks, MD  Received her labs 11/4 and gave her a call. Liver function looks good.  We will schedule a RUQ [place] for her. Slight elevation of calcium is likely nothing of concern, plan to recheck in a few months  Let me know if any worsening or change of her sx   Results for orders placed or performed in visit on 06/22/15  CBC  Result Value Ref Range   WBC 8.9 4.0 - 10.5 K/uL   RBC 5.05 3.87 - 5.11 MIL/uL   Hemoglobin 14.6 12.0 - 15.0 g/dL   HCT 43.3 36.0 - 46.0 %   MCV 85.7 78.0 - 100.0 fL   MCH 28.9 26.0 - 34.0 pg   MCHC 33.7 30.0 - 36.0 g/dL   RDW 12.8 11.5 - 15.5 %   Platelets 389 150 - 400 K/uL   MPV 9.4 8.6 - 12.4 fL  Comprehensive metabolic panel  Result Value Ref Range   Sodium 136 135 - 146 mmol/L   Potassium 5.0 3.5 - 5.3 mmol/L   Chloride 101 98 - 110 mmol/L   CO2 27 20 - 31 mmol/L   Glucose, Bld 86 65 - 99 mg/dL   BUN 14 7 - 25 mg/dL   Creat 0.77 0.50 - 0.99 mg/dL   Total Bilirubin 1.0 0.2 - 1.2 mg/dL   Alkaline Phosphatase 61 33 - 130 U/L   AST 20 10 - 35 U/L   ALT 22 6 - 29 U/L   Total Protein 7.0 6.1 - 8.1 g/dL   Albumin 4.2 3.6 - 5.1 g/dL   Calcium 10.5 (H) 8.6 - 10.4 mg/dL

## 2015-06-23 LAB — COMPREHENSIVE METABOLIC PANEL
ALK PHOS: 61 U/L (ref 33–130)
ALT: 22 U/L (ref 6–29)
AST: 20 U/L (ref 10–35)
Albumin: 4.2 g/dL (ref 3.6–5.1)
BILIRUBIN TOTAL: 1 mg/dL (ref 0.2–1.2)
BUN: 14 mg/dL (ref 7–25)
CALCIUM: 10.5 mg/dL — AB (ref 8.6–10.4)
CO2: 27 mmol/L (ref 20–31)
Chloride: 101 mmol/L (ref 98–110)
Creat: 0.77 mg/dL (ref 0.50–0.99)
GLUCOSE: 86 mg/dL (ref 65–99)
Potassium: 5 mmol/L (ref 3.5–5.3)
Sodium: 136 mmol/L (ref 135–146)
Total Protein: 7 g/dL (ref 6.1–8.1)

## 2015-06-23 LAB — CBC
HEMATOCRIT: 43.3 % (ref 36.0–46.0)
HEMOGLOBIN: 14.6 g/dL (ref 12.0–15.0)
MCH: 28.9 pg (ref 26.0–34.0)
MCHC: 33.7 g/dL (ref 30.0–36.0)
MCV: 85.7 fL (ref 78.0–100.0)
MPV: 9.4 fL (ref 8.6–12.4)
Platelets: 389 10*3/uL (ref 150–400)
RBC: 5.05 MIL/uL (ref 3.87–5.11)
RDW: 12.8 % (ref 11.5–15.5)
WBC: 8.9 10*3/uL (ref 4.0–10.5)

## 2015-06-24 ENCOUNTER — Encounter: Payer: Self-pay | Admitting: Family Medicine

## 2015-06-24 NOTE — Addendum Note (Signed)
Addended by: Lamar Blinks C on: 06/24/2015 09:24 AM   Modules accepted: Orders

## 2015-07-12 NOTE — Progress Notes (Signed)
Cardiology Office Note   Date:  07/18/2015   ID:  Annesia, Aills 1945/09/04, MRN [id number]  PCP:  Lamar Blinks, MD  Cardiologist:   Sharol Harness, MD   Chief Complaint  Patient presents with  . New Evaluation    no light headedness or dizziness no swelling in legs ankles or feet  . Shortness of Breath    pt states she is ver SOB and really tired  . Chest Pain    every now and then she may have a little tinge of chest pain      History of Present Illness: LAZETTE LEMLEY is a 69 y.o. female with hypertension, hyperlipidemia, and obesity who presents for an evaluation of shortness of breath.  Ms. Yaldo was seen by her PCP, Dr. Lamar Blinks, on 11/2. At that appointment she reported shortness of breath.  She was concerned about her heart because her father had an arrhythmia and heart failure.  Ms. Darrington s noted occasional shortness of breath and exhaustion been ongoing for several months now. She's recently decided to take charge of her health and has started to increase her exercise regimen.He works out with a Clinical research associate twice per week doing both cardio and weights. She does not have chest pain but does have shortness of breath with these activities.She also recently participated in any walk up on more she walked 10 miles a day for 3 days in a row. She noted shortness of breath but no chest pain. She does have occasional episodes of left-sided chest pain that does not radiate and is 1-2 out of 10 in severity. There is associated shortness of breath but no lightheadedness, dizziness, palpitations, nausea, or diaphoresis.Happens very infrequently and typically occurs at rest.  He is left-handed and sometimes wonders whether it may be due to a pulled chest muscle.  He also notices shortness of breath when climbing stairs.  He denies lower extremity edema, orthopnea or PND.  Ms. Metheney does note some lightheadedness that she's noticed from bending over to standing.  This is not  associated with palpitations and she has not had syncope.  She has ,yofacial pain in her jaw x 10 year.  Uses Naproxen and ibuprofen for this pain.Marland Kitchengardening   Ms. Bajo has been prescribed simvastatin but has not been taking it regularly. Not taking simvastatin regularly.   Past Medical History  Diagnosis Date  . Vasomotor rhinitis   . HTN (hypertension)   . Hyperlipidemia   . Obesity   . Myofacial muscle pain     FACIAL  . Personal history of colonic polyps 2009    Repeat colonoscopy 2014    Past Surgical History  Procedure Laterality Date  . Cholecystectomy  2002  . Abdominal hysterectomy  1994    PARTIAL-fibroids     Current Outpatient Prescriptions  Medication Sig Dispense Refill  . cetirizine (ZYRTEC) 10 MG tablet Take 10 mg by mouth daily.    . naproxen sodium (ANAPROX) 550 MG tablet Take 1 tablet (550 mg total) by mouth 2 (two) times daily with a meal. Use as needed for pain 90 tablet 3  . simvastatin (ZOCOR) 40 MG tablet Take 1 tablet (40 mg total) by mouth every evening. 90 tablet 3   No current facility-administered medications for this visit.    Allergies:   Review of patient's allergies indicates no known allergies.    Social History:  The patient  reports that she has never smoked. She does not have any smokeless  tobacco history on file. She reports that she drinks about 2.0 oz of alcohol per week. She reports that she does not use illicit drugs.   Family History:  The patient's family history includes Atrial fibrillation in her father; Cancer in her maternal grandmother; Dystonia in her mother; Hypertension in her father.    ROS:  Please see the history of present illness.   Otherwise, review of systems are positive for none.   All other systems are reviewed and negative.    PHYSICAL EXAM: VS:  BP 136/84 mmHg  Pulse 74  Ht 5\' 4"  (1.626 m)  Wt 96.389 kg (212 lb 8 oz)  BMI 36.46 kg/m2 , BMI Body mass index is 36.46 kg/(m^2). GENERAL:  Well  appearing HEENT:  Pupils equal round and reactive, fundi not visualized, oral mucosa unremarkable NECK:  No jugular venous distention, waveform within normal limits, carotid upstroke brisk and symmetric, no bruits, no thyromegaly LYMPHATICS:  No cervical adenopathy LUNGS:  Clear to auscultation bilaterally HEART:  RRR.  PMI not displaced or sustained,S1 and S2 within normal limits, no S3, no S4, no clicks, no rubs, no murmurs ABD:  Flat, positive bowel sounds normal in frequency in pitch, no bruits, no rebound, no guarding, no midline pulsatile mass, no hepatomegaly, no splenomegaly EXT:  2 plus pulses throughout, no edema, no cyanosis no clubbing SKIN:  No rashes no nodules NEURO:  Cranial nerves II through XII grossly intact, motor grossly intact throughout PSYCH:  Cognitively intact, oriented to person place and time    EKG:  EKG is ordered today. The ekg ordered today demonstrates inus rhythm rate 74 bpm. RS are primein V1 and V2. Cannot rule out septal infarct.   Recent Labs: 10/18/2014: TSH 0.784 06/22/2015: ALT 22; BUN 14; Creat 0.77; Hemoglobin 14.6; Platelets 389; Potassium 5.0; Sodium 136    Lipid Panel    Component Value Date/Time   CHOL 229* 10/18/2014 0926   TRIG 241* 10/18/2014 0926   HDL 46 10/18/2014 0926   CHOLHDL 5.0 10/18/2014 0926   VLDL 48* 10/18/2014 0926   LDLCALC 135* 10/18/2014 0926      Wt Readings from Last 3 Encounters:  07/18/15 96.389 kg (212 lb 8 oz)  06/22/15 94.711 kg (208 lb 12.8 oz)  02/22/15 96.888 kg (213 lb 9.6 oz)      ASSESSMENT AND PLAN:  # Atypical chest pain: Symptoms are quite atypical and do not occur with exertion. She does have risk factors including hypertension and poorly controlled hyperlipidemia.Therefore we will obtain an exercise Cardiolite to evaluate for ischemia. Her EKG is concerning for possible prior septal MI.  # Shortness of breath: Symptoms are likely due to obesity and deconditioning. She does not have any  signs of heart failure on exam. However we will btain an echocardiogram to rule out structural heart disease, especially given her family history of heart failure.  # Hyperlipidemia: Lipids are not at goal.Her ASCVD 10 year risk is 12.7%. She is going to start taking her simvastatin more regularly. We've also advised that she increase her exercise to 30-40 minutes most days of the week.  Given that she takes NSAIDs regularly and the recent data that aspirin for primary prevention may not be as effective as previously thought, I will not start aspirin 81 mg at this time.  We will reassess her lipids in 3 months and consider if her ASCVD 10 year risk is not lower.  # Obesity: Encouraged to increase physical activity and weight loss as above.  Current medicines are reviewed at length with the patient today.  The patient does not have concerns regarding medicines.  The following changes have been made:  no change  Labs/ tests ordered today include:   Orders Placed This Encounter  Procedures  . Myocardial Perfusion Imaging  . EKG 12-Lead  . ECHOCARDIOGRAM COMPLETE     Disposition:   FU with Hanin Decook C. Oval Linsey, MD in 3 months.    Signed, Sharol Harness, MD  07/18/2015 9:14 AM    [facility]

## 2015-07-18 ENCOUNTER — Ambulatory Visit (INDEPENDENT_AMBULATORY_CARE_PROVIDER_SITE_OTHER): Payer: PRIVATE HEALTH INSURANCE | Admitting: Cardiovascular Disease

## 2015-07-18 ENCOUNTER — Encounter: Payer: Self-pay | Admitting: Cardiovascular Disease

## 2015-07-18 VITALS — BP 136/84 | HR 74 | Ht 64.0 in | Wt 212.5 lb

## 2015-07-18 DIAGNOSIS — R0602 Shortness of breath: Secondary | ICD-10-CM | POA: Diagnosis not present

## 2015-07-18 DIAGNOSIS — E669 Obesity, unspecified: Secondary | ICD-10-CM

## 2015-07-18 DIAGNOSIS — I10 Essential (primary) hypertension: Secondary | ICD-10-CM

## 2015-07-18 DIAGNOSIS — R0789 Other chest pain: Secondary | ICD-10-CM | POA: Diagnosis not present

## 2015-07-18 DIAGNOSIS — E785 Hyperlipidemia, unspecified: Secondary | ICD-10-CM | POA: Diagnosis not present

## 2015-07-18 DIAGNOSIS — R079 Chest pain, unspecified: Secondary | ICD-10-CM

## 2015-07-18 HISTORY — DX: Other chest pain: R07.89

## 2015-07-18 NOTE — Patient Instructions (Signed)
Your physician has requested that you have an echocardiogram. This will be done at our [facility] location. The address is [address], [address]. Echocardiography is a painless test that uses sound waves to create images of your heart. It provides your doctor with information about the size and shape of your heart and how well your heart's chambers and valves are working. This procedure takes approximately one hour. There are no restrictions for this procedure.  Your physician has requested that you have en exercise stress myoview. For further information please visit [email or web address]. Please follow instruction sheet, as given.  Dr Oval Linsey recommends that you schedule a follow-up appointment in 3 months.  If you need a refill on your cardiac medications before your next appointment, please call your pharmacy.

## 2015-07-25 ENCOUNTER — Ambulatory Visit
Admission: RE | Admit: 2015-07-25 | Discharge: 2015-07-25 | Disposition: A | Discharge status: Home / Self Care | Payer: PRIVATE HEALTH INSURANCE | Source: Ambulatory Visit | Attending: Family Medicine | Admitting: Family Medicine

## 2015-07-25 DIAGNOSIS — R1011 Right upper quadrant pain: Principal | ICD-10-CM

## 2015-07-25 DIAGNOSIS — G8929 Other chronic pain: Secondary | ICD-10-CM

## 2015-07-27 ENCOUNTER — Ambulatory Visit (HOSPITAL_COMMUNITY): Payer: PRIVATE HEALTH INSURANCE | Attending: Cardiovascular Disease

## 2015-07-27 ENCOUNTER — Other Ambulatory Visit: Payer: Self-pay

## 2015-07-27 DIAGNOSIS — E785 Hyperlipidemia, unspecified: Secondary | ICD-10-CM | POA: Diagnosis not present

## 2015-07-27 DIAGNOSIS — R0602 Shortness of breath: Secondary | ICD-10-CM | POA: Diagnosis not present

## 2015-07-27 DIAGNOSIS — I517 Cardiomegaly: Secondary | ICD-10-CM | POA: Insufficient documentation

## 2015-07-27 DIAGNOSIS — Z6836 Body mass index (BMI) 36.0-36.9, adult: Secondary | ICD-10-CM | POA: Diagnosis not present

## 2015-07-27 DIAGNOSIS — I10 Essential (primary) hypertension: Secondary | ICD-10-CM | POA: Diagnosis not present

## 2015-07-27 DIAGNOSIS — E669 Obesity, unspecified: Secondary | ICD-10-CM | POA: Diagnosis not present

## 2015-07-27 DIAGNOSIS — R0600 Dyspnea, unspecified: Secondary | ICD-10-CM | POA: Diagnosis present

## 2015-07-28 ENCOUNTER — Telehealth: Payer: Self-pay | Admitting: *Deleted

## 2015-07-28 NOTE — Telephone Encounter (Signed)
-----   Message from Skeet Latch, MD sent at 07/27/2015  4:10 PM EST ----- Echo is mostly normal but shows that her heart does not relax completely.  It will be important keep her blood pressure well-controlled.

## 2015-07-28 NOTE — Telephone Encounter (Signed)
Left message to call back  

## 2015-07-29 ENCOUNTER — Telehealth (HOSPITAL_COMMUNITY): Payer: Self-pay

## 2015-07-29 NOTE — Telephone Encounter (Signed)
Encounter complete. 

## 2015-07-31 ENCOUNTER — Encounter: Payer: Self-pay | Admitting: Family Medicine

## 2015-08-01 ENCOUNTER — Telehealth: Payer: Self-pay | Admitting: Cardiovascular Disease

## 2015-08-01 NOTE — Telephone Encounter (Signed)
Spoke to patient. Result given . Verbalized understanding  

## 2015-08-01 NOTE — Telephone Encounter (Signed)
Shelby Flores is calling because she has another question ,. Spoke w/ Trixie Dredge (RN) earlier today .Marland KitchenPlease call   Thanks

## 2015-08-02 ENCOUNTER — Telehealth: Payer: Self-pay | Admitting: Cardiovascular Disease

## 2015-08-02 NOTE — Telephone Encounter (Signed)
PATIENT CALLED TODAY   Patient states she had a question concerning  Echo. RN answered question. Patient wanted to know if she can see Dr Oval Linsey after stress test on 12/151/6 RN informed patient that that would not be able to schedule for the same day. Offered next available - 08/18/15 Schedule for 11:15 that day.  Patient would like if Dr Oval Linsey had anytime to give her a call- [phone]) She states she is little anxious RN tried to reassure patient.  Patient is aware will defer to Dr [facility]

## 2015-08-03 ENCOUNTER — Ambulatory Visit (HOSPITAL_COMMUNITY)
Admission: RE | Admit: 2015-08-03 | Discharge: 2015-08-03 | Disposition: A | Discharge status: Home / Self Care | Payer: PRIVATE HEALTH INSURANCE | Source: Ambulatory Visit | Attending: Cardiology | Admitting: Cardiology

## 2015-08-03 DIAGNOSIS — Z6836 Body mass index (BMI) 36.0-36.9, adult: Secondary | ICD-10-CM | POA: Diagnosis not present

## 2015-08-03 DIAGNOSIS — Z8249 Family history of ischemic heart disease and other diseases of the circulatory system: Secondary | ICD-10-CM | POA: Diagnosis not present

## 2015-08-03 DIAGNOSIS — R0602 Shortness of breath: Secondary | ICD-10-CM | POA: Insufficient documentation

## 2015-08-03 DIAGNOSIS — I10 Essential (primary) hypertension: Secondary | ICD-10-CM | POA: Insufficient documentation

## 2015-08-03 DIAGNOSIS — R5383 Other fatigue: Secondary | ICD-10-CM | POA: Diagnosis not present

## 2015-08-03 DIAGNOSIS — R0609 Other forms of dyspnea: Secondary | ICD-10-CM | POA: Insufficient documentation

## 2015-08-03 DIAGNOSIS — E669 Obesity, unspecified: Secondary | ICD-10-CM | POA: Diagnosis not present

## 2015-08-03 DIAGNOSIS — R079 Chest pain, unspecified: Secondary | ICD-10-CM

## 2015-08-03 LAB — MYOCARDIAL PERFUSION IMAGING
CHL CUP NUCLEAR SDS: 1
CHL CUP NUCLEAR SRS: 5
CHL CUP NUCLEAR SSS: 6
CSEPED: 6 min
CSEPPHR: 141 {beats}/min
Estimated workload: 7 METS
LV dias vol: 67 mL
LVSYSVOL: 21 mL
MPHR: 152 {beats}/min
Percent HR: 92 %
RPE: 15
Rest HR: 70 {beats}/min
TID: 1.27

## 2015-08-03 MED ORDER — TECHNETIUM TC 99M SESTAMIBI GENERIC - CARDIOLITE
10.6000 | Freq: Once | INTRAVENOUS | Status: AC | PRN
  Administered 2015-08-03: 11 via INTRAVENOUS

## 2015-08-03 MED ORDER — TECHNETIUM TC 99M SESTAMIBI GENERIC - CARDIOLITE
30.8000 | Freq: Once | INTRAVENOUS | Status: AC | PRN
  Administered 2015-08-03: 30.8 via INTRAVENOUS

## 2015-08-03 NOTE — Telephone Encounter (Signed)
Pt needed to inform me of medication list update.

## 2015-08-05 ENCOUNTER — Telehealth: Payer: Self-pay | Admitting: *Deleted

## 2015-08-05 NOTE — Telephone Encounter (Signed)
-----   Message from Skeet Latch, MD sent at 08/05/2015  7:31 AM EST ----- Normal stress.

## 2015-08-05 NOTE — Telephone Encounter (Signed)
Left message to call back  

## 2015-08-11 NOTE — Telephone Encounter (Signed)
Spoke to patient.  Stress test Result given . Verbalized understanding Cancelled 08/18/15 appt.  Keeping 09/2015 appointment

## 2015-08-11 NOTE — Telephone Encounter (Signed)
Returned call to patient.  She expressed appreciation.

## 2015-08-18 ENCOUNTER — Ambulatory Visit: Payer: PRIVATE HEALTH INSURANCE | Admitting: Cardiovascular Disease

## 2015-09-09 ENCOUNTER — Encounter: Payer: Self-pay | Admitting: Family Medicine

## 2015-09-14 ENCOUNTER — Encounter: Payer: Self-pay | Admitting: Family Medicine

## 2015-10-18 ENCOUNTER — Ambulatory Visit: Payer: PRIVATE HEALTH INSURANCE | Admitting: Cardiovascular Disease

## 2015-10-27 ENCOUNTER — Other Ambulatory Visit: Payer: Self-pay | Admitting: Family Medicine

## 2015-10-27 DIAGNOSIS — I10 Essential (primary) hypertension: Secondary | ICD-10-CM

## 2015-10-28 NOTE — Telephone Encounter (Signed)
Last filled: Historical Med Last OV:  06/22/15  Please advise.

## 2015-11-15 ENCOUNTER — Ambulatory Visit: Payer: PRIVATE HEALTH INSURANCE | Admitting: Cardiovascular Disease

## 2015-11-17 ENCOUNTER — Other Ambulatory Visit: Payer: Self-pay | Admitting: Family Medicine

## 2015-11-22 ENCOUNTER — Encounter: Payer: Self-pay | Admitting: Internal Medicine

## 2015-12-26 ENCOUNTER — Ambulatory Visit: Payer: PRIVATE HEALTH INSURANCE | Admitting: Cardiovascular Disease

## 2016-01-05 ENCOUNTER — Ambulatory Visit (AMBULATORY_SURGERY_CENTER): Payer: Self-pay

## 2016-01-05 VITALS — Ht 65.0 in | Wt 214.6 lb

## 2016-01-05 DIAGNOSIS — Z860100 Personal history of colon polyps, unspecified: Secondary | ICD-10-CM

## 2016-01-05 MED ORDER — SUPREP BOWEL PREP KIT 17.5-3.13-1.6 GM/177ML PO SOLN: 1.0000 | Freq: Once | ORAL | Status: DC

## 2016-01-05 NOTE — Progress Notes (Signed)
No allergies to eggs or soy No past problems with anesthesia No home oxygen No diet meds  Has email and internet; declined emmi 

## 2016-01-11 ENCOUNTER — Encounter: Payer: Self-pay | Admitting: Internal Medicine

## 2016-01-23 ENCOUNTER — Ambulatory Visit (AMBULATORY_SURGERY_CENTER): Payer: PRIVATE HEALTH INSURANCE | Admitting: Internal Medicine

## 2016-01-23 ENCOUNTER — Encounter: Payer: Self-pay | Admitting: Internal Medicine

## 2016-01-23 VITALS — BP 121/75 | HR 181 | Temp 98.4°F | Resp 27 | Ht 65.0 in | Wt 214.0 lb

## 2016-01-23 DIAGNOSIS — K635 Polyp of colon: Secondary | ICD-10-CM | POA: Diagnosis not present

## 2016-01-23 DIAGNOSIS — Z86010 Personal history of colonic polyps: Secondary | ICD-10-CM | POA: Diagnosis not present

## 2016-01-23 DIAGNOSIS — D122 Benign neoplasm of ascending colon: Secondary | ICD-10-CM | POA: Diagnosis not present

## 2016-01-23 MED ORDER — SODIUM CHLORIDE 0.9 % IV SOLN: 500.0000 mL | INTRAVENOUS | Status: DC

## 2016-01-23 NOTE — Progress Notes (Signed)
Called to room to assist during endoscopic procedure.  Patient ID and intended procedure confirmed with present staff. Received instructions for my participation in the procedure from the performing physician.  

## 2016-01-23 NOTE — Op Note (Signed)
[place] Patient Name: Shelby Flores Procedure Date: 01/23/2016 11:18 AM MRN: [id number] Endoscopist: Docia Chuck. Henrene Pastor , MD Age: 70 Referring MD:  Date of Birth: 12/27/1945 Gender: Female Procedure:                Colonoscopy with snare polypectomy x 1 Indications:              High risk colon cancer surveillance: Personal                            history of colonic polyps. Index exam in                            [place]. Last exam 2009 with diminutive distal                            hyperplastic polyp Medicines:                Monitored Anesthesia Care Procedure:                Pre-Anesthesia Assessment:                           - Prior to the procedure, a History and Physical                            was performed, and patient medications and                            allergies were reviewed. The patient's tolerance of                            previous anesthesia was also reviewed. The risks                            and benefits of the procedure and the sedation                            options and risks were discussed with the patient.                            All questions were answered, and informed consent                            was obtained. Prior Anticoagulants: The patient has                            taken no previous anticoagulant or antiplatelet                            agents. ASA Grade Assessment: II - A patient with                            mild systemic disease. After reviewing the risks  and benefits, the patient was deemed in                            satisfactory condition to undergo the procedure.                           After obtaining informed consent, the colonoscope                            was passed under direct vision. Throughout the                            procedure, the patient's blood pressure, pulse, and                            oxygen saturations were monitored continuously. The                           Model CF-HQ190L [id number]) scope was introduced                            through the anus and advanced to the the cecum,                            identified by appendiceal orifice and ileocecal                            valve. The ileocecal valve, appendiceal orifice,                            and rectum were photographed. The quality of the                            bowel preparation was excellent. The colonoscopy                            was performed without difficulty. The patient                            tolerated the procedure well. The bowel preparation                            used was SUPREP. Scope In: 11:26:55 AM Scope Out: 11:40:35 AM Scope Withdrawal Time: 0 hours 10 minutes 40 seconds  Total Procedure Duration: 0 hours 13 minutes 40 seconds  Findings:                 A 5 mm polyp was found in the ascending colon. The                            polyp was sessile. The polyp was removed with a                            cold snare. Resection and retrieval were complete.  The exam was otherwise without abnormality on                            direct and retroflexion views. Complications:            No immediate complications. Estimated blood loss:                            None. Estimated Blood Loss:     Estimated blood loss: none. Impression:               - One 5 mm polyp in the ascending colon, removed                            with a cold snare. Resected and retrieved.                           - The examination was otherwise normal on direct                            and retroflexion views. Recommendation:           - Repeat colonoscopy in 5-10 years for surveillance.                           - Patient has a contact number available for                            emergencies. The signs and symptoms of potential                            delayed complications were discussed with the                             patient. Return to normal activities tomorrow.                            Written discharge instructions were provided to the                            patient.                           - Resume previous diet.                           - Continue present medications.                           - Await pathology results. Docia Chuck. Henrene Pastor, MD 01/23/2016 11:44:20 AM This report has been signed electronically. CC Letter to:             Orma Flaming

## 2016-01-23 NOTE — Progress Notes (Signed)
Report to PACU, RN, vss, BBS= Clear.  

## 2016-01-23 NOTE — Patient Instructions (Signed)
YOU HAD AN ENDOSCOPIC PROCEDURE TODAY AT [facility]:   Refer to the procedure report that was given to you for any specific questions about what was found during the examination.  If the procedure report does not answer your questions, please call your gastroenterologist to clarify.  If you requested that your care partner not be given the details of your procedure findings, then the procedure report has been included in a sealed envelope for you to review at your convenience later.  YOU SHOULD EXPECT: Some feelings of bloating in the abdomen. Passage of more gas than usual.  Walking can help get rid of the air that was put into your GI tract during the procedure and reduce the bloating. If you had a lower endoscopy (such as a colonoscopy or flexible sigmoidoscopy) you may notice spotting of blood in your stool or on the toilet paper. If you underwent a bowel prep for your procedure, you may not have a normal bowel movement for a few days.  Please Note:  You might notice some irritation and congestion in your nose or some drainage.  This is from the oxygen used during your procedure.  There is no need for concern and it should clear up in a day or so.  SYMPTOMS TO REPORT IMMEDIATELY:   Following lower endoscopy (colonoscopy or flexible sigmoidoscopy):  Excessive amounts of blood in the stool  Significant tenderness or worsening of abdominal pains  Swelling of the abdomen that is new, acute  Fever of 100F or higher   For urgent or emergent issues, a gastroenterologist can be reached at any hour by calling [phone].   DIET: Your first meal following the procedure should be a small meal and then it is ok to progress to your normal diet. Heavy or fried foods are harder to digest and may make you feel nauseous or bloated.  Likewise, meals heavy in dairy and vegetables can increase bloating.  Drink plenty of fluids but you should avoid alcoholic beverages for 24  hours.  ACTIVITY:  You should plan to take it easy for the rest of today and you should NOT DRIVE or use heavy machinery until tomorrow (because of the sedation medicines used during the test).    FOLLOW UP: Our staff will call the number listed on your records the next business day following your procedure to check on you and address any questions or concerns that you may have regarding the information given to you following your procedure. If we do not reach you, we will leave a message.  However, if you are feeling well and you are not experiencing any problems, there is no need to return our call.  We will assume that you have returned to your regular daily activities without incident.  If any biopsies were taken you will be contacted by phone or by letter within the next 1-3 weeks.  Please call us at [phone] if you have not heard about the biopsies in 3 weeks.    SIGNATURES/CONFIDENTIALITY: You and/or your care partner have signed paperwork which will be entered into your electronic medical record.  These signatures attest to the fact that that the information above on your After Visit Summary has been reviewed and is understood.  Full responsibility of the confidentiality of this discharge information lies with you and/or your care-partner.  Read all of the handouts given to you by your recovery room nurse.  Thank-you for choosing [place] for your healthcare needs today.

## 2016-01-24 ENCOUNTER — Telehealth: Payer: Self-pay | Admitting: *Deleted

## 2016-01-24 NOTE — Telephone Encounter (Signed)
Message says the line is busy and cannot take the call or leave a message.

## 2016-01-30 ENCOUNTER — Encounter: Payer: Self-pay | Admitting: Internal Medicine

## 2016-07-30 ENCOUNTER — Other Ambulatory Visit: Payer: Self-pay | Admitting: Family Medicine

## 2016-09-18 ENCOUNTER — Encounter: Payer: Self-pay | Admitting: Physician Assistant

## 2016-09-18 ENCOUNTER — Ambulatory Visit (INDEPENDENT_AMBULATORY_CARE_PROVIDER_SITE_OTHER): Payer: PRIVATE HEALTH INSURANCE | Admitting: Physician Assistant

## 2016-09-18 VITALS — BP 136/86 | HR 93 | Temp 98.3°F | Resp 16 | Ht 65.0 in | Wt 196.0 lb

## 2016-09-18 DIAGNOSIS — Z7689 Persons encountering health services in other specified circumstances: Secondary | ICD-10-CM | POA: Diagnosis not present

## 2016-09-18 DIAGNOSIS — L659 Nonscarring hair loss, unspecified: Secondary | ICD-10-CM

## 2016-09-18 DIAGNOSIS — E785 Hyperlipidemia, unspecified: Secondary | ICD-10-CM | POA: Diagnosis not present

## 2016-09-18 DIAGNOSIS — Z01818 Encounter for other preprocedural examination: Secondary | ICD-10-CM | POA: Diagnosis not present

## 2016-09-18 DIAGNOSIS — Z23 Encounter for immunization: Secondary | ICD-10-CM

## 2016-09-18 DIAGNOSIS — E2839 Other primary ovarian failure: Secondary | ICD-10-CM

## 2016-09-18 MED ORDER — ZOSTER VAC RECOMB ADJUVANTED 50 MCG/0.5ML IM SUSR: 50.0000 ug | Freq: Once | INTRAMUSCULAR | 0 refills | Status: AC

## 2016-09-18 NOTE — Progress Notes (Signed)
Patient ID: Shelby Flores, female    DOB: 1946/08/12, 71 y.o.   MRN: [id number]  PCP: Kennon Portela, MD  Chief Complaint  Patient presents with  . Follow-up    check knee injury before surgery.  . Alopecia    pt states she is losing hair more often  . Flu Vaccine    Subjective:   Presents for preoperative clearance for LEFT knee surgery to repair a torn meniscus. She has failed 2 steroid injections and PT.  I last saw her 12/25/2011. In the interim, she has seen my colleagues and had cardiology evaluation for atypical chest pain. Stress test was normal. Echo was normal. LVEF 68% 06/2015.  Work continues to be very stressful. Recently had a particularly difficult employee which exacerbated the situation, but he has left.  Inconsistent simvastatin use due to feeling drugged the morning following each dose. It doesn't happen when she doesn't take it.   Episodic bleeding from the gums. Sees [facility] regularly and has changed to a soft brush. Inconsistent flossing.   Notes there hair has been thinning. Wonders if it's related to the recent increased stress at work, or her thyroid function.  Her son, Barth Kirks, has graduated from [employer], living in [place] with [employer]. Loving it.  Review of Systems  Constitutional: Negative.   HENT: Negative.   Eyes: Negative.   Respiratory: Negative.   Cardiovascular: Negative.   Gastrointestinal: Negative.   Endocrine: Negative.   Genitourinary: Negative.   Musculoskeletal: Positive for arthralgias (LEFT knee) and gait problem.  Skin: Negative.        Thinning hair  Allergic/Immunologic: Negative.   Hematological: Negative.   Psychiatric/Behavioral: Negative.     Patient Active Problem List   Diagnosis Date Noted  . Atypical chest pain 07/18/2015  . Essential tremor 10/18/2014  . Vasomotor rhinitis   . HTN (hypertension)   . Hyperlipidemia   . Obesity   . Myofacial muscle pain   . Personal history of colonic polyps      Prior  to Admission medications   Medication Sig Start Date End Date Taking? Authorizing Provider  benazepril (LOTENSIN) 10 MG tablet take 1 tablet by mouth once daily 10/28/15  Yes Gay Filler Copland, MD  Multiple Vitamins-Minerals (ICAPS AREDS 2 PO) Take by mouth.   Yes Historical Provider, MD  naproxen sodium (ANAPROX) 550 MG tablet take 1 tablet by mouth twice a day with food use if needed for pain 07/31/16  Yes Gay Filler Copland, MD  simvastatin (ZOCOR) 40 MG tablet take 1 tablet by mouth every evening 11/17/15  Yes Jessica C Copland, MD  cetirizine (ZYRTEC) 10 MG tablet Take 10 mg by mouth as needed.     Historical Provider, MD     No Known Allergies     Objective:  Physical Exam  Constitutional: She is oriented to person, place, and time. She appears well-developed and well-nourished. She is active and cooperative. No distress.  BP 136/86   Pulse 93   Temp 98.3 F (36.8 C) (Oral)   Resp 16   Ht 5\' 5"  (1.651 m)   Wt 196 lb (88.9 kg)   SpO2 97%   BMI 32.62 kg/m   HENT:  Head: Normocephalic and atraumatic.  Right Ear: Hearing normal.  Left Ear: Hearing normal.  Eyes: Conjunctivae are normal. No scleral icterus.  Neck: Normal range of motion. Neck supple. No thyromegaly present.  Cardiovascular: Normal rate, regular rhythm and normal heart sounds.   Pulses:  Radial pulses are 2+ on the right side, and 2+ on the left side.       Dorsalis pedis pulses are 2+ on the right side, and 2+ on the left side.       Posterior tibial pulses are 2+ on the right side, and 2+ on the left side.  Pulmonary/Chest: Effort normal and breath sounds normal.  Musculoskeletal:  Ambulates with a straight cane  Lymphadenopathy:       Head (right side): No tonsillar, no preauricular, no posterior auricular and no occipital adenopathy present.       Head (left side): No tonsillar, no preauricular, no posterior auricular and no occipital adenopathy present.    She has no cervical adenopathy.       Right:  No supraclavicular adenopathy present.       Left: No supraclavicular adenopathy present.  Neurological: She is alert and oriented to person, place, and time. No sensory deficit.  Skin: Skin is warm, dry and intact. No rash noted. No cyanosis or erythema. Nails show no clubbing.  Psychiatric: She has a normal mood and affect. Her speech is normal and behavior is normal.    EKG reviewed with Dr. Tamala Julian. Sinus rhythm. Low voltage in anterior leads. Unchanged from tracing 07/18/2015.       Assessment & Plan:   1. Preoperative clearance Await labs. Anticipate normal results and clearance for knee surgery. - CBC with Differential/Platelet - Comprehensive metabolic panel - EKG [id number]  2. Encounter to establish care  3. Hyperlipidemia, unspecified hyperlipidemia type - Lipid panel  4. Alopecia Check thyroid function. Consider recent stress as cause of thinning hair.  - Thyroid Panel With TSH  5. Need for influenza vaccination - Flu Vaccine QUAD 36+ mos IM  6. Need for pneumococcal vaccination - Pneumococcal polysaccharide vaccine 23-valent greater than or equal to 2yo subcutaneous/IM  7. Need for shingles vaccine She will wait to get this live vaccine until AFTER her upcoming surgery. - Zoster Vac Recomb Adjuvanted ([place]) 50 MCG SUSR; Inject 50 mcg into the muscle once.  Dispense: 1 each; Refill: 0  8. Estrogen deficiency DEXA many years ago. Update. - DG Bone Density; Future   Fara Chute, PA-C Physician Assistant-Certified [facility]

## 2016-09-18 NOTE — Patient Instructions (Signed)
     IF you received an x-ray today, you will receive an invoice from [facility]. Please contact [facility] at [phone] with questions or concerns regarding your invoice.   IF you received labwork today, you will receive an invoice from [facility]. Please contact [facility] at [phone] with questions or concerns regarding your invoice.   Our billing staff will not be able to assist you with questions regarding bills from these companies.  You will be contacted with the lab results as soon as they are available. The fastest way to get your results is to activate your My Chart account. Instructions are located on the last page of this paperwork. If you have not heard from us regarding the results in 2 weeks, please contact this office.     

## 2016-09-19 LAB — CBC WITH DIFFERENTIAL/PLATELET
BASOS: 0 %
Basophils Absolute: 0 10*3/uL (ref 0.0–0.2)
EOS (ABSOLUTE): 0.1 10*3/uL (ref 0.0–0.4)
EOS: 2 %
HEMATOCRIT: 42.5 % (ref 34.0–46.6)
Hemoglobin: 14.3 g/dL (ref 11.1–15.9)
Immature Grans (Abs): 0 10*3/uL (ref 0.0–0.1)
Immature Granulocytes: 0 %
LYMPHS ABS: 1.7 10*3/uL (ref 0.7–3.1)
Lymphs: 28 %
MCH: 29.7 pg (ref 26.6–33.0)
MCHC: 33.6 g/dL (ref 31.5–35.7)
MCV: 88 fL (ref 79–97)
MONOS ABS: 0.6 10*3/uL (ref 0.1–0.9)
Monocytes: 10 %
NEUTROS ABS: 3.6 10*3/uL (ref 1.4–7.0)
Neutrophils: 60 %
Platelets: 329 10*3/uL (ref 150–379)
RBC: 4.82 x10E6/uL (ref 3.77–5.28)
RDW: 13.7 % (ref 12.3–15.4)
WBC: 6.1 10*3/uL (ref 3.4–10.8)

## 2016-09-19 LAB — COMPREHENSIVE METABOLIC PANEL
A/G RATIO: 1.8 (ref 1.2–2.2)
ALT: 19 IU/L (ref 0–32)
AST: 13 IU/L (ref 0–40)
Albumin: 4.2 g/dL (ref 3.6–4.8)
Alkaline Phosphatase: 71 IU/L (ref 39–117)
BILIRUBIN TOTAL: 0.7 mg/dL (ref 0.0–1.2)
BUN/Creatinine Ratio: 26 (ref 12–28)
BUN: 17 mg/dL (ref 8–27)
CHLORIDE: 103 mmol/L (ref 96–106)
CO2: 24 mmol/L (ref 18–29)
Calcium: 10.2 mg/dL (ref 8.7–10.3)
Creatinine, Ser: 0.65 mg/dL (ref 0.57–1.00)
GFR calc Af Amer: 105 mL/min/{1.73_m2} (ref >59)
GFR, EST NON AFRICAN AMERICAN: 91 mL/min/{1.73_m2} (ref >59)
GLOBULIN, TOTAL: 2.3 g/dL (ref 1.5–4.5)
Glucose: 109 mg/dL — ABNORMAL HIGH (ref 65–99)
POTASSIUM: 4.7 mmol/L (ref 3.5–5.2)
SODIUM: 140 mmol/L (ref 134–144)
Total Protein: 6.5 g/dL (ref 6.0–8.5)

## 2016-09-19 LAB — THYROID PANEL WITH TSH
Free Thyroxine Index: 2.2 (ref 1.2–4.9)
T3 Uptake Ratio: 26 % (ref 24–39)
T4, Total: 8.6 ug/dL (ref 4.5–12.0)
TSH: 1.18 u[IU]/mL (ref 0.450–4.500)

## 2016-09-19 LAB — LIPID PANEL
CHOL/HDL RATIO: 3.6 ratio (ref 0.0–4.4)
Cholesterol, Total: 204 mg/dL — ABNORMAL HIGH (ref 100–199)
HDL: 56 mg/dL (ref >39)
LDL CALC: 127 mg/dL — AB (ref 0–99)
TRIGLYCERIDES: 106 mg/dL (ref 0–149)
VLDL Cholesterol Cal: 21 mg/dL (ref 5–40)

## 2016-09-21 ENCOUNTER — Encounter: Payer: Self-pay | Admitting: Physician Assistant

## 2016-09-25 ENCOUNTER — Telehealth: Payer: Self-pay | Admitting: Family Medicine

## 2016-09-25 NOTE — Telephone Encounter (Signed)
See results, letter to pt. And dr dr Veverly Fells pt advised

## 2016-09-25 NOTE — Telephone Encounter (Signed)
Pt wants to make sure her labs are okay for surgery..  Please advise: [phone]

## 2016-12-01 ENCOUNTER — Other Ambulatory Visit: Payer: Self-pay | Admitting: Physician Assistant

## 2016-12-01 DIAGNOSIS — I10 Essential (primary) hypertension: Secondary | ICD-10-CM

## 2016-12-03 NOTE — Telephone Encounter (Signed)
Meds ordered this encounter  Medications  . naproxen sodium (ANAPROX) 550 MG tablet    Sig: take 1 tablet by mouth twice a day with food USE if needed for pain    Dispense:  60 tablet    Refill:  0  . benazepril (LOTENSIN) 10 MG tablet    Sig: take 1 tablet by mouth once daily    Dispense:  90 tablet    Refill:  3

## 2017-01-06 ENCOUNTER — Other Ambulatory Visit: Payer: Self-pay | Admitting: Physician Assistant

## 2017-02-26 ENCOUNTER — Ambulatory Visit (INDEPENDENT_AMBULATORY_CARE_PROVIDER_SITE_OTHER): Payer: PRIVATE HEALTH INSURANCE | Admitting: Physician Assistant

## 2017-02-26 ENCOUNTER — Encounter: Payer: Self-pay | Admitting: Physician Assistant

## 2017-02-26 VITALS — BP 133/83 | HR 82 | Temp 98.0°F | Resp 18 | Ht 65.0 in | Wt 204.6 lb

## 2017-02-26 DIAGNOSIS — J300 Vasomotor rhinitis: Secondary | ICD-10-CM

## 2017-02-26 DIAGNOSIS — J0100 Acute maxillary sinusitis, unspecified: Secondary | ICD-10-CM | POA: Diagnosis not present

## 2017-02-26 MED ORDER — AMOXICILLIN-POT CLAVULANATE 875-125 MG PO TABS
1.0000 | ORAL_TABLET | Freq: Two times a day (BID) | ORAL | 0 refills | Status: DC
Start: 2017-02-26 — End: 2018-01-23

## 2017-02-26 MED ORDER — AZELASTINE HCL 0.1 % NA SOLN: 2.0000 | Freq: Two times a day (BID) | NASAL | 0 refills | Status: DC

## 2017-02-26 MED ORDER — FLUTICASONE PROPIONATE 50 MCG/ACT NA SUSP
2.0000 | Freq: Every day | NASAL | 6 refills | Status: DC
Start: 2017-02-26 — End: 2019-04-17

## 2017-02-26 MED ORDER — GUAIFENESIN ER 1200 MG PO TB12: 1.0000 | ORAL_TABLET | Freq: Two times a day (BID) | ORAL | 1 refills | Status: DC | PRN

## 2017-02-26 MED ORDER — BENZONATATE 100 MG PO CAPS: 100.0000 mg | ORAL_CAPSULE | Freq: Three times a day (TID) | ORAL | 0 refills | Status: DC | PRN

## 2017-02-26 NOTE — Patient Instructions (Addendum)
It was nice to see you today!  Start using BOTH the nasal sprays. Use the azelastine first, wait 5-10 minutes, then use the Flonase. When you are improved, stop the azelastine, and continue the Flonase to reduce the risk of this occurring again. once you finish the antibiotics that have been prescribed to you today.  THE 3 SIMPLE RULES FOR NASAL SPRAY USE: 1. Don't snort. 2. Look at your toes and spray up your nose. 3. Use the opposite hand to spray in both nostrils.  IF you received an x-ray today, you will receive an invoice from [facility]. Please contact [facility] at [phone] with questions or concerns regarding your invoice.   IF you received labwork today, you will receive an invoice from [facility]. Please contact [facility] at [phone] with questions or concerns regarding your invoice.   Our billing staff will not be able to assist you with questions regarding bills from these companies.  You will be contacted with the lab results as soon as they are available. The fastest way to get your results is to activate your My Chart account. Instructions are located on the last page of this paperwork. If you have not heard from [place] regarding the results in 2 weeks, please contact this office.

## 2017-02-26 NOTE — Progress Notes (Signed)
Patient ID: Shelby Flores, female    DOB: 09/19/1945, 71 y.o.   MRN: [id number]  PCP: Harrison Mons, PA-C  Chief Complaint  Patient presents with  . Sinus Problem    x1 month, per pt has sinus pressure, low energy, ears feel full with a ear ache in her left ear. Per pt reports tightness in the chest and no pain or numbness in her neck or arms but report some aches in her joints.    Subjective:   Presents for evaluation of 1 month of worsening sinus pressure and cogestion.  She has vasomotor rhinitis chronically. About 3 months ago she developed facial pain and increased congestion. She was seen at [facility] and treated for sinusitis with Augmentin. Her symptoms resolved completely until 4 weeks ago, when she caught a viral illness that was going around at her office. Since then, her symptoms have progressed. Associated symptoms include fatigue, intermittent muscle aches, cough and LEFT ear pain.  No sore throat, runny nose, fever.  DEXA and mammogram are scheduled.     Review of Systems As above.    Patient Active Problem List   Diagnosis Date Noted  . Atypical chest pain 07/18/2015  . Essential tremor 10/18/2014  . Vasomotor rhinitis   . HTN (hypertension)   . Hyperlipidemia   . Obesity   . Myofacial muscle pain   . Personal history of colonic polyps      Prior to Admission medications   Medication Sig Start Date End Date Taking? Authorizing Provider  benazepril (LOTENSIN) 10 MG tablet take 1 tablet by mouth once daily 12/03/16  Yes Hargis Vandyne, PA-C  cetirizine (ZYRTEC) 10 MG tablet Take 10 mg by mouth as needed.    Yes [provider]  Multiple Vitamins-Minerals (ICAPS AREDS 2 PO) Take by mouth.   Yes [provider]  naproxen sodium (ANAPROX) 550 MG tablet take 1 tablet by mouth twice a day with food USE if needed for pain 12/03/16  Yes Jazzlene Huot, PA-C  simvastatin (ZOCOR) 40 MG tablet take 1 tablet by mouth every evening  01/08/17  Yes Lynde Ludwig, PA-C     No Known Allergies     Objective:  Physical Exam  Constitutional: She is oriented to person, place, and time. She appears well-developed and well-nourished. No distress.  BP 133/83 (BP Location: Right Arm, Patient Position: Sitting, Cuff Size: Normal)   Pulse 82   Temp 98 F (36.7 C) (Oral)   Resp 18   Ht 5\' 5"  (1.651 m)   Wt 204 lb 9.6 oz (92.8 kg)   SpO2 96%   BMI 34.05 kg/m    HENT:  Head: Normocephalic and atraumatic.  Right Ear: Hearing, tympanic membrane, external ear and ear canal normal.  Left Ear: Hearing, tympanic membrane, external ear and ear canal normal.  Nose: Mucosal edema and rhinorrhea present.  No foreign bodies. Right sinus exhibits no maxillary sinus tenderness and no frontal sinus tenderness. Left sinus exhibits maxillary sinus tenderness. Left sinus exhibits no frontal sinus tenderness.  Mouth/Throat: Uvula is midline, oropharynx is clear and moist and mucous membranes are normal. No uvula swelling. No oropharyngeal exudate.  Eyes: Conjunctivae and EOM are normal. Pupils are equal, round, and reactive to light. Right eye exhibits no discharge. Left eye exhibits no discharge. No scleral icterus.  Neck: Trachea normal, normal range of motion and full passive range of motion without pain. Neck supple. No thyroid mass and no thyromegaly present.  Cardiovascular: Normal  rate, regular rhythm and normal heart sounds.   Pulmonary/Chest: Effort normal and breath sounds normal.  Lymphadenopathy:       Head (right side): No submandibular, no tonsillar, no preauricular, no posterior auricular and no occipital adenopathy present.       Head (left side): No submandibular, no tonsillar, no preauricular and no occipital adenopathy present.    She has no cervical adenopathy.       Right: No supraclavicular adenopathy present.       Left: No supraclavicular adenopathy present.  Neurological: She is alert and oriented to person, place,  and time. She has normal strength. No cranial nerve deficit or sensory deficit.  Skin: Skin is warm, dry and intact. No rash noted.  Psychiatric: She has a normal mood and affect. Her speech is normal and behavior is normal.           Assessment & Plan:   Problem List Items Addressed This Visit    Vasomotor rhinitis    Start Flonase for maintenance treatment.      Relevant Medications   fluticasone (FLONASE) 50 MCG/ACT nasal spray    Other Visit Diagnoses    Subacute maxillary sinusitis    -  Primary   Supportive care. Anticipatory guidance.    Relevant Medications   azelastine (ASTELIN) 0.1 % nasal spray   benzonatate (TESSALON) 100 MG capsule   Guaifenesin (MUCINEX MAXIMUM STRENGTH) 1200 MG TB12   fluticasone (FLONASE) 50 MCG/ACT nasal spray   amoxicillin-clavulanate (AUGMENTIN) 875-125 MG tablet       Return in about 6 months (around 08/29/2017), or if symptoms worsen or fail to improve, for wellness and re-evaluation of lipids and blood pressure.   Fara Chute, PA-C [facility]

## 2017-02-26 NOTE — Progress Notes (Signed)
Subjective:    Patient ID: Shelby Flores, female    DOB: 1945/11/28, 71 y.o.   MRN: [id number]  HPI Shelby Flores is a 71 year old Caucasian female with a history of vasomotor rhinitis and sinusitis for evaluation of sinus concerns.  [facility] and was treated with augmentin. Symptoms resolved completely.  She contracted a "cold" about a month ago that has not gone away. She says it was going around at work. She reports low energy, congestion, and "pain in my cheekbones," severity 2/10. The cough and left ear pain are new. "My whole head feels full. My eyes are real roomy and have stuff in them." Denies fever, chills. Endorses intermittent myalgias and occasional postnasal drip. Denies rhinorrhea, sore throat, neck pain. Has been taking emergen-c "to improve my fatigue." She takes Aleve for jaw pain.  She has a mammogram and DEXA scan scheduled.   Review of Systems See above.  Patient Active Problem List   Diagnosis Date Noted  . Atypical chest pain 07/18/2015  . Essential tremor 10/18/2014  . Vasomotor rhinitis   . HTN (hypertension)   . Hyperlipidemia   . Obesity   . Myofacial muscle pain   . Personal history of colonic polyps    Prior to Admission medications   Medication Sig Start Date End Date Taking? Authorizing Provider  benazepril (LOTENSIN) 10 MG tablet take 1 tablet by mouth once daily 12/03/16   Harrison Mons, PA-C  cetirizine (ZYRTEC) 10 MG tablet Take 10 mg by mouth as needed.     [provider]  Multiple Vitamins-Minerals (ICAPS AREDS 2 PO) Take by mouth.    [provider]  naproxen sodium (ANAPROX) 550 MG tablet take 1 tablet by mouth twice a day with food USE if needed for pain 12/03/16   Harrison Mons, PA-C  simvastatin (ZOCOR) 40 MG tablet take 1 tablet by mouth every evening 01/08/17   Harrison Mons, PA-C   No Known Allergies     Objective:   Physical Exam  Constitutional: She appears  well-developed and well-nourished.  BP 133/83 (BP Location: Right Arm, Patient Position: Sitting, Cuff Size: Normal)   Pulse 82   Temp 98 F (36.7 C) (Oral)   Resp 18   Ht 5\' 5"  (1.651 m)   Wt 204 lb 9.6 oz (92.8 kg)   SpO2 96%   BMI 34.05 kg/m    HENT:  Head: Normocephalic and atraumatic.  Right Ear: Hearing, tympanic membrane, external ear and ear canal normal.  Left Ear: Hearing, external ear and ear canal normal.  Nose: Right sinus exhibits no maxillary sinus tenderness and no frontal sinus tenderness. Left sinus exhibits maxillary sinus tenderness. Left sinus exhibits no frontal sinus tenderness.  Mouth/Throat: Oropharynx is clear and moist.  Poor visualization of left TM.  Eyes: EOM are normal.  Neck: Normal range of motion.  Cardiovascular: Normal rate, regular rhythm and normal heart sounds.   Pulmonary/Chest: Effort normal and breath sounds normal.  Lymphadenopathy:    She has no cervical adenopathy.  Neurological: She is alert.  Skin: Skin is warm and dry.  Psychiatric: She has a normal mood and affect. Her behavior is normal.       Assessment & Plan:   1. Subacute maxillary sinusitis Rx augmentin. Supportive care. Follow up as needed. - azelastine (ASTELIN) 0.1 % nasal spray; Place 2 sprays into both nostrils 2 (two) times daily. Use in each nostril as directed  Dispense:  30 mL; Refill: 0 - benzonatate (TESSALON) 100 MG capsule; Take 1-2 capsules (100-200 mg total) by mouth 3 (three) times daily as needed for cough.  Dispense: 40 capsule; Refill: 0 - Guaifenesin (MUCINEX MAXIMUM STRENGTH) 1200 MG TB12; Take 1 tablet (1,200 mg total) by mouth every 12 (twelve) hours as needed.  Dispense: 14 tablet; Refill: 1 - amoxicillin-clavulanate (AUGMENTIN) 875-125 MG tablet; Take 1 tablet by mouth 2 (two) times daily.  Dispense: 20 tablet; Refill: 0  2. Vasomotor rhinitis Rx Flonase to decrease recurrence of sinusitis. Instructions given.  - fluticasone (FLONASE) 50 MCG/ACT  nasal spray; Place 2 sprays into both nostrils daily.  Dispense: 16 g; Refill: 6  3. Hyperlipidemia, unspecified hyperlipidemia type 4. Hypertension, unspecified type 5. Class 1 obesity due to excess calories with body mass index (BMI) of 33.0 to 33.9 in adult, unspecified whether serious comorbidity present Follow up in 6 months for wellness visit, sooner if needed.

## 2017-02-26 NOTE — Assessment & Plan Note (Signed)
Start Flonase for maintenance treatment.

## 2017-04-05 ENCOUNTER — Other Ambulatory Visit: Payer: Self-pay | Admitting: Physician Assistant

## 2017-06-17 LAB — HM DEXA SCAN

## 2017-06-20 ENCOUNTER — Encounter: Payer: Self-pay | Admitting: Physician Assistant

## 2017-06-27 IMAGING — US US ABDOMEN LIMITED
1 series · 14 of 25 positions shown · non-contrast
Comparison: Report from 01/07/2001 ultrasound

CLINICAL DATA: Chronic right upper quadrant pain.

EXAM:
US ABDOMEN LIMITED - RIGHT UPPER QUADRANT

[Series 1: us abdomen limited · 0.37mm/px · 14 of 31 slices shown]
[im 1/31]
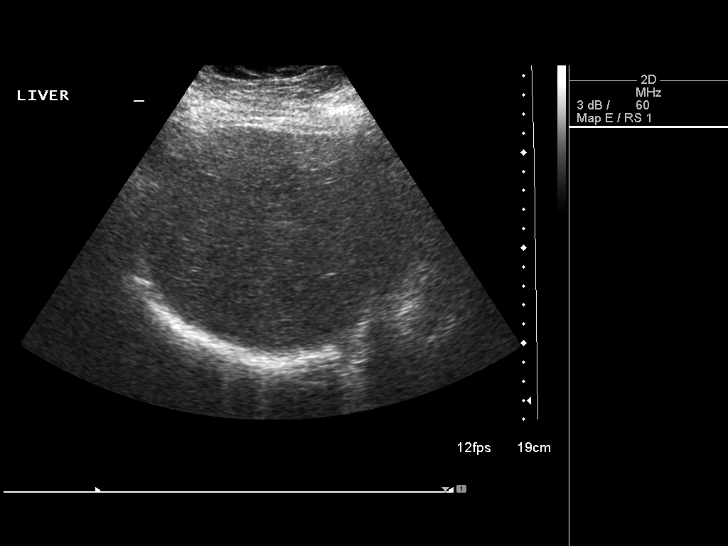
[im 3/31]
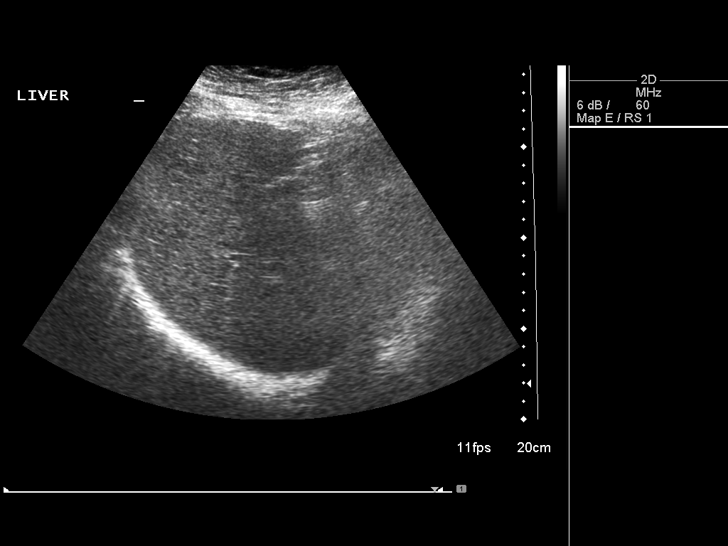
[im 6/31]
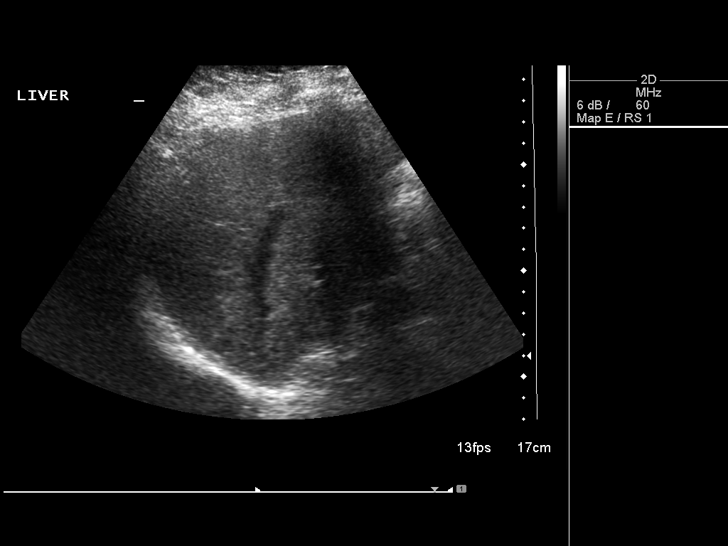
[im 8/31]
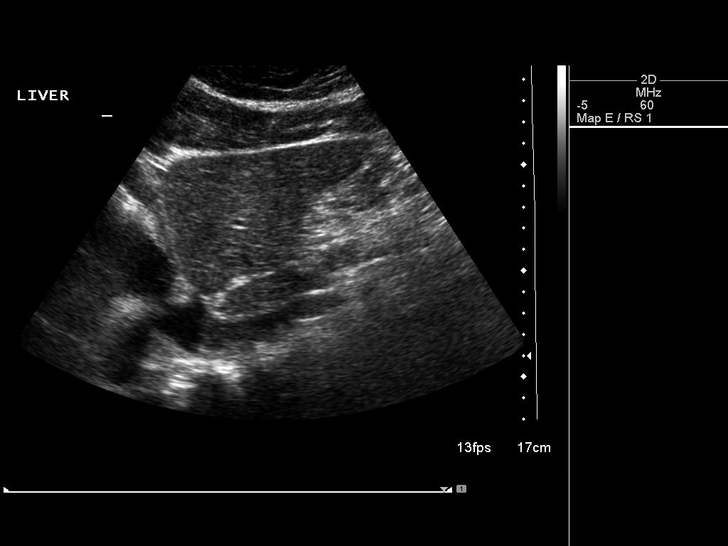
[im 11/31]
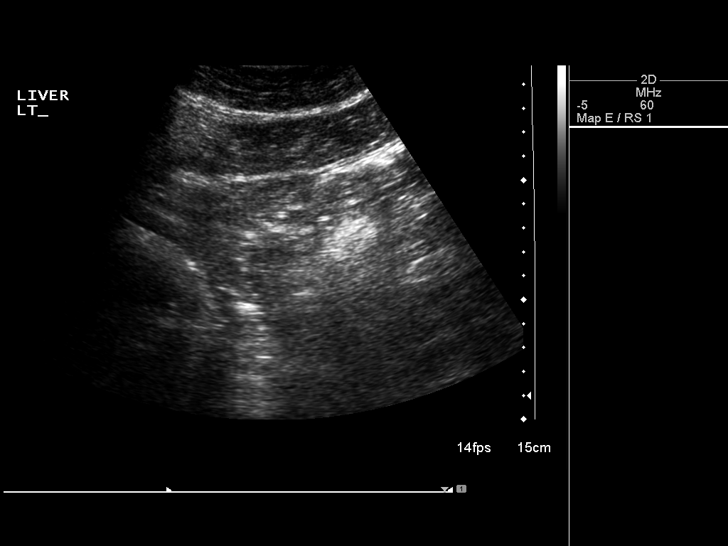
[im 12/31]
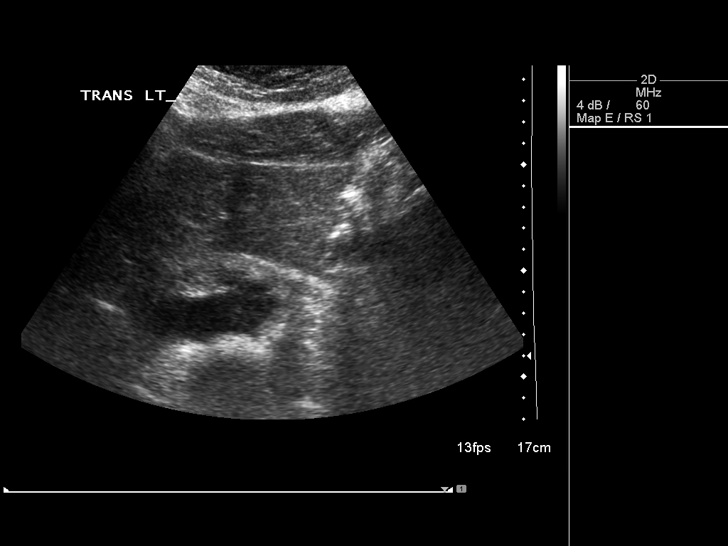
[im 14/31]
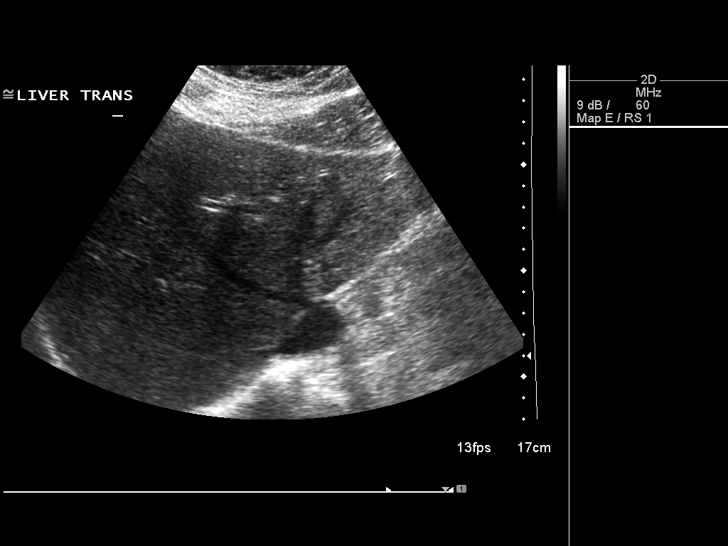
[im 17/31]
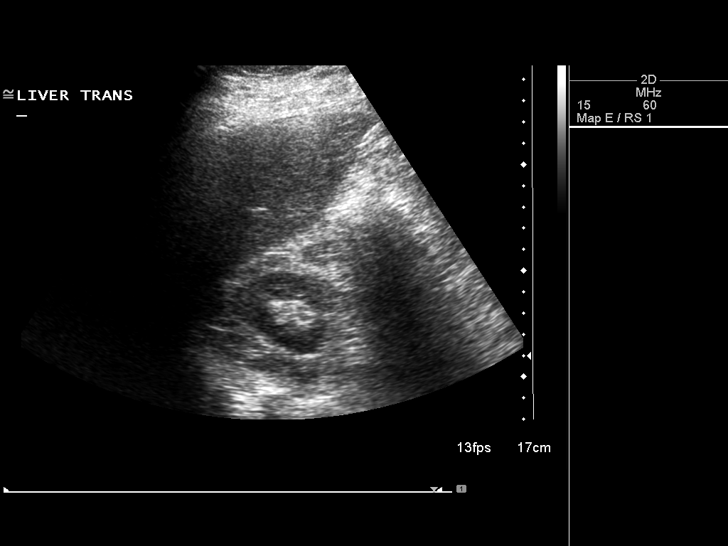
[im 19/31]
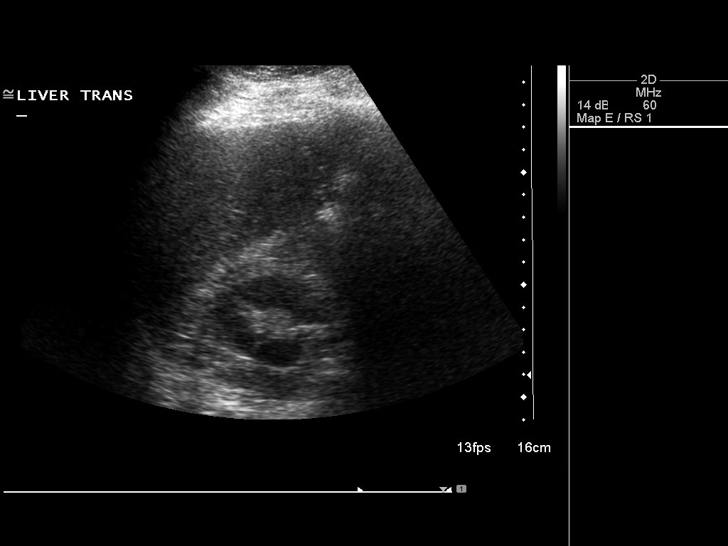
[im 21/31]
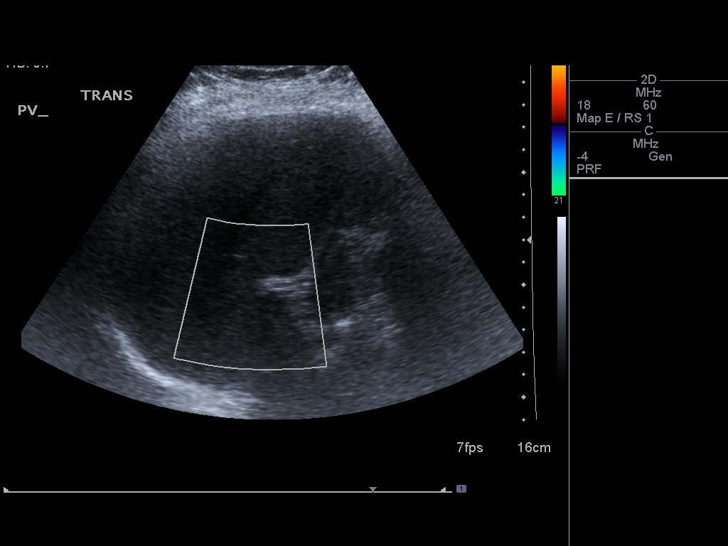
[im 23/31]
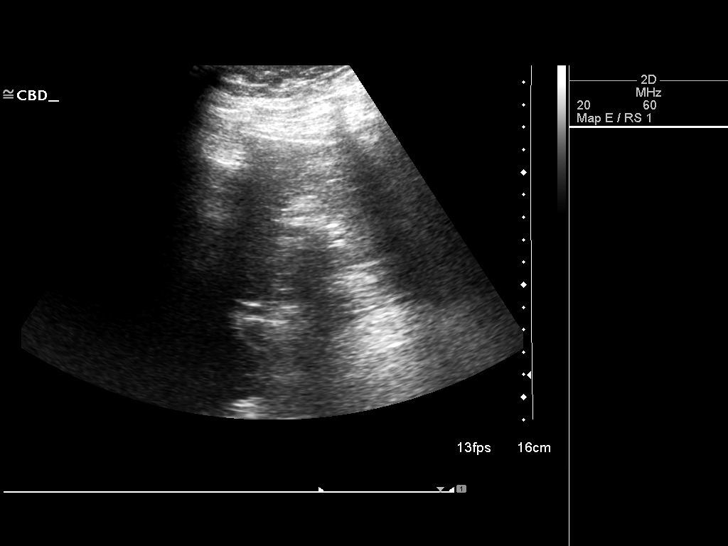
[im 26/31]
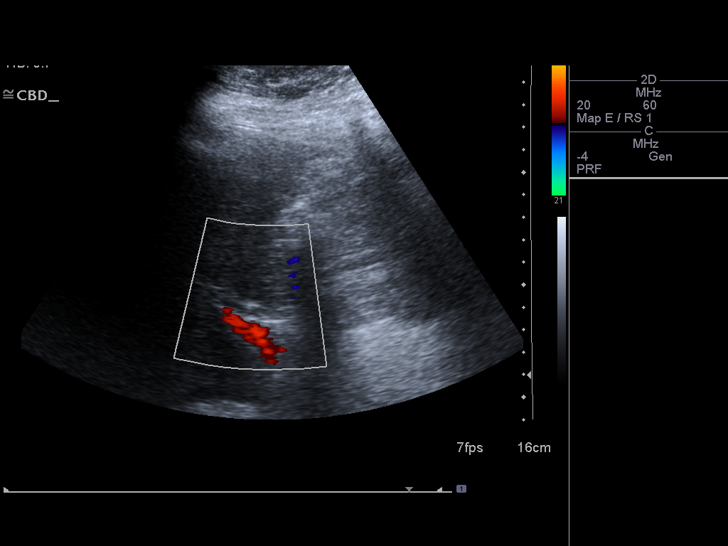
[im 28/31]
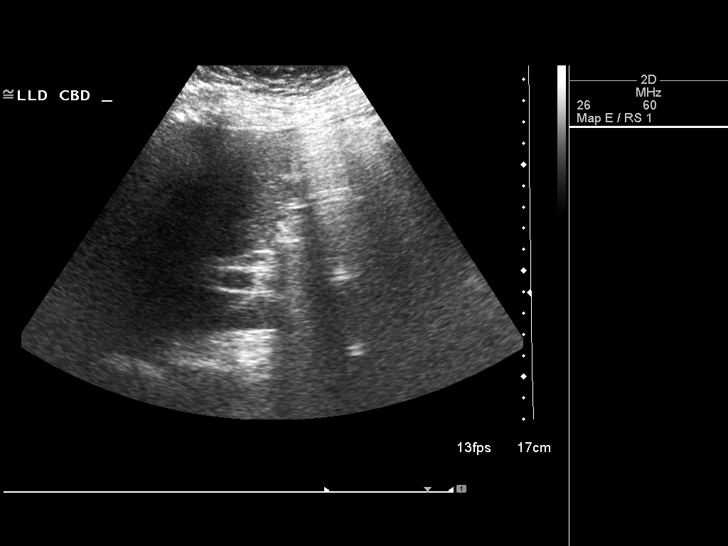
[im 31/31]
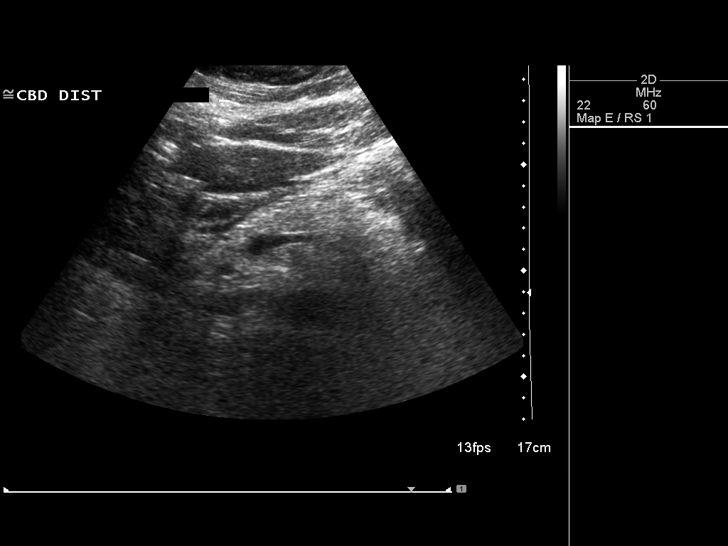

[14 of 25 positions shown; findings below may reference images not displayed]

FINDINGS: Gallbladder:

Surgically absent

Common bile duct:

Diameter: 5 mm.  Where visualized, no filling defect.

Liver:

No focal lesion identified. Within normal limits in parenchymal
echogenicity. Antegrade flow in the imaged portal venous system.
IMPRESSION: Negative right upper quadrant ultrasound post cholecystectomy.

## 2017-07-31 DIAGNOSIS — M8580 Other specified disorders of bone density and structure, unspecified site: Secondary | ICD-10-CM | POA: Insufficient documentation

## 2017-10-25 IMAGING — NM NM MISC PROCEDURE
6 series · 36 of 36 positions shown · non-contrast
Comparison: none

[Series 1: wbr rest · 6.40mm/px · 6 of 64 frames shown]
[frame 6/64]
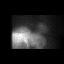
[frame 16/64]
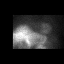
[frame 27/64]
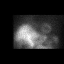
[frame 38/64]
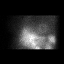
[frame 48/64]
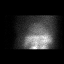
[frame 59/64]
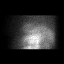

[Series 1: wbr_r-proj_st wbr rest · 6.40mm/px · 6 of 64 frames shown]
[frame 6/64]
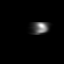
[frame 16/64]
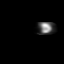
[frame 27/64]
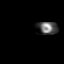
[frame 38/64]
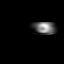
[frame 48/64]
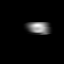
[frame 59/64]
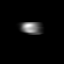

[Series 2: wbr stress-gsp · 6.40mm/px · 6 of 496 frames shown]
[frame 42/496  full-range]
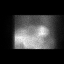
[frame 124/496  full-range]
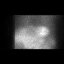
[frame 207/496  full-range]
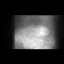
[frame 290/496  full-range]
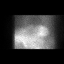
[frame 372/496  full-range]
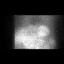
[frame 455/496  full-range]
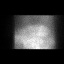

[Series 2: wbr_s-proj_st wbr stress-gsp · 6.40mm/px · 6 of 512 frames shown]
[frame 43/512]
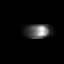
[frame 128/512]
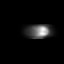
[frame 214/512]
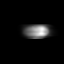
[frame 299/512]
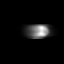
[frame 384/512]
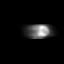
[frame 470/512]
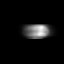

[Series 3: wbr stress-sum-em · 6.40mm/px · 6 of 64 frames shown]
[frame 6/64]
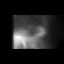
[frame 16/64]
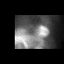
[frame 27/64]
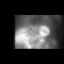
[frame 38/64]
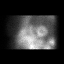
[frame 48/64]
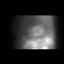
[frame 59/64]
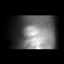

[Series 3: wbr_s-proj_st wbr stress-sum-em · 6.40mm/px · 6 of 64 frames shown]
[frame 6/64]
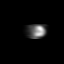
[frame 16/64]
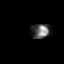
[frame 27/64]
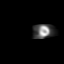
[frame 38/64]
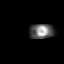
[frame 48/64]
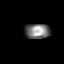
[frame 59/64]
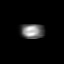

[36 of 36 positions shown; findings below may reference images not displayed]

Canned report from images found in remote index.

Refer to host system for actual result text.

## 2017-11-04 ENCOUNTER — Other Ambulatory Visit: Payer: Self-pay | Admitting: Physician Assistant

## 2017-11-25 ENCOUNTER — Encounter: Payer: Self-pay | Admitting: Physician Assistant

## 2017-12-09 ENCOUNTER — Other Ambulatory Visit: Payer: Self-pay | Admitting: Physician Assistant

## 2017-12-09 DIAGNOSIS — I10 Essential (primary) hypertension: Secondary | ICD-10-CM

## 2017-12-24 LAB — HM MAMMOGRAPHY

## 2018-01-15 ENCOUNTER — Other Ambulatory Visit: Payer: Self-pay | Admitting: Family Medicine

## 2018-01-15 ENCOUNTER — Other Ambulatory Visit: Payer: Self-pay | Admitting: Physician Assistant

## 2018-01-15 DIAGNOSIS — I10 Essential (primary) hypertension: Secondary | ICD-10-CM

## 2018-01-16 NOTE — Telephone Encounter (Signed)
Patient called, left VM on home and cell phone to return call to the office to schedule yearly physical appointment before any medication can be refilled.

## 2018-01-23 ENCOUNTER — Encounter: Payer: Self-pay | Admitting: Physician Assistant

## 2018-01-23 ENCOUNTER — Ambulatory Visit (INDEPENDENT_AMBULATORY_CARE_PROVIDER_SITE_OTHER): Payer: PRIVATE HEALTH INSURANCE | Admitting: Physician Assistant

## 2018-01-23 VITALS — BP 144/84 | HR 86 | Temp 98.6°F | Resp 16 | Ht 64.0 in | Wt 205.4 lb

## 2018-01-23 DIAGNOSIS — R5383 Other fatigue: Secondary | ICD-10-CM | POA: Diagnosis not present

## 2018-01-23 DIAGNOSIS — Z0000 Encounter for general adult medical examination without abnormal findings: Secondary | ICD-10-CM | POA: Diagnosis not present

## 2018-01-23 DIAGNOSIS — I10 Essential (primary) hypertension: Secondary | ICD-10-CM

## 2018-01-23 DIAGNOSIS — M8580 Other specified disorders of bone density and structure, unspecified site: Secondary | ICD-10-CM

## 2018-01-23 DIAGNOSIS — M25641 Stiffness of right hand, not elsewhere classified: Secondary | ICD-10-CM | POA: Diagnosis not present

## 2018-01-23 DIAGNOSIS — E785 Hyperlipidemia, unspecified: Secondary | ICD-10-CM

## 2018-01-23 MED ORDER — SIMVASTATIN 40 MG PO TABS: 40.0000 mg | ORAL_TABLET | Freq: Every evening | ORAL | 3 refills | Status: DC

## 2018-01-23 MED ORDER — BENAZEPRIL HCL 10 MG PO TABS: 10.0000 mg | ORAL_TABLET | Freq: Every day | ORAL | 3 refills | Status: DC

## 2018-01-23 NOTE — Patient Instructions (Addendum)
Try to purchase some over the counter "Systane Ultra" or "Systane Complete" eye drops for dry eyes. Also, try stopping the zyrtec and see if that helps remove the eye discharge. If not, make appointment with your eye doctor. If the eye discharge improves, but your allergies worsen, call us back to prescribe allergy medication alternatives.   Go ahead and call [facility] to schedule your next visit with me there. [phone].   IF you received an x-ray today, you will receive an invoice from [facility]. Please contact [facility] at [phone] with questions or concerns regarding your invoice.   IF you received labwork today, you will receive an invoice from [facility]. Please contact [facility] at [phone] with questions or concerns regarding your invoice.   Our billing staff will not be able to assist you with questions regarding bills from these companies.  You will be contacted with the lab results as soon as they are available. The fastest way to get your results is to activate your My Chart account. Instructions are located on the last page of this paperwork. If you have not heard from [place] regarding the results in 2 weeks, please contact this office.

## 2018-01-23 NOTE — Progress Notes (Signed)
Patient ID: Shelby Flores, female    DOB: [birth year], 72 y.o.   MRN: [id number]  PCP: Harrison Mons, PA-C  Chief Complaint  Patient presents with  . Annual Exam    Subjective:   Presents for [employer].  Cervical Cancer Screening: s/p hysterectomy 1994, fibroids. No previous abnormal paps. No longer a candidate.. Breast Cancer Screening: small density in the LEFT breast indeterminate 05/2017. 6 month follow-up reportedly normal. Colorectal Cancer Screening: colonoscopy 01/2016, sessile polyp. To follow-up 5-10 years. Bone Density Testing: DEXA 05/2017 revealed osteopenia. HIV Screening: very low risk STI Screening: very low risk Seasonal Influenza Vaccination: recommend annually Td/Tdap Vaccination: 01/2014 Pneumococcal Vaccination: series complete Zoster Vaccination: 08/2017 Frequency of Dental evaluation: recommend Q6 months Frequency of Eye evaluation: annually   Review of Systems Constitutional: Positive for fatigue ("tired for a while"). Negative for chills and fever.  HENT: Negative for congestion, hearing loss, rhinorrhea, sore throat and tinnitus.   Eyes: Positive for discharge (light green (medial canthi)). Negative for photophobia, pain and visual disturbance.  Respiratory: Negative for cough, chest tightness and shortness of breath.   Cardiovascular: Negative for chest pain and palpitations.  Gastrointestinal: Negative for abdominal pain, blood in stool, constipation, diarrhea, nausea and vomiting.  Endocrine: Negative for cold intolerance, heat intolerance and polyuria.  Genitourinary: Negative for dysuria, frequency, hematuria, pelvic pain, vaginal bleeding, vaginal discharge and vaginal pain.  Musculoskeletal: Positive for arthralgias (right pinky/ring fingers). Negative for joint swelling and neck stiffness.  Skin: Negative for color change and rash.  Neurological: Negative for dizziness, light-headedness, numbness and headaches.    Psychiatric/Behavioral: Negative for agitation and confusion. The patient is nervous/anxious (work/selling home).    Depression screen [facility] 2/9 01/23/2018 02/26/2017 09/18/2016 06/22/2015 02/22/2015  Decreased Interest 0 0 0 0 0  Down, Depressed, Hopeless 0 0 0 0 0  PHQ - 2 Score 0 0 0 0 0    Patient Active Problem List   Diagnosis Date Noted  . Osteopenia 07/31/2017  . Atypical chest pain 07/18/2015  . Essential tremor 10/18/2014  . Vasomotor rhinitis   . HTN (hypertension)   . Hyperlipidemia   . Obesity   . Myofacial muscle pain   . Personal history of colonic polyps     Past Medical History:  Diagnosis Date  . Allergy   . Atypical chest pain 07/18/2015  . HTN (hypertension)   . Hyperlipidemia   . Myofacial muscle pain    FACIAL  . Obesity   . Personal history of colonic polyps 2009   Repeat colonoscopy 2014  . Vasomotor rhinitis      Prior to Admission medications   Medication Sig Start Date End Date Taking? Authorizing Provider  benazepril (LOTENSIN) 10 MG tablet TAKE 1 TABLET BY MOUTH ONCE DAILY 12/10/17  Yes Wardell Honour, MD  cetirizine (ZYRTEC) 10 MG tablet Take 10 mg by mouth as needed.    Yes [provider]  fluticasone (FLONASE) 50 MCG/ACT nasal spray Place 2 sprays into both nostrils daily. 02/26/17  Yes Morris Markham, PA-C  simvastatin (ZOCOR) 40 MG tablet take 1 tablet by mouth every evening 04/08/17  Yes Harol Shabazz, PA-C    No Known Allergies  Past Surgical History:  Procedure Laterality Date  . ABDOMINAL HYSTERECTOMY  1994   PARTIAL-fibroids  . CHOLECYSTECTOMY  2002  . GANGLION CYST EXCISION  2002   lt hand    Family History  Problem Relation Age of Onset  . Dystonia Mother  masseter  . Hypertension Father   . Atrial fibrillation Father        pacemaker  . Heart failure Father   . Colon polyps Father   . Cancer Maternal Grandmother        colon  . Lung cancer Sister   . Stroke Paternal Grandfather   . Cancer Maternal  Grandfather   . Colon cancer Maternal Grandfather   . Colon polyps Brother     Social History   Socioeconomic History  . Marital status: Married    Spouse name: Not on file  . Number of children: Not on file  . Years of education: Not on file  . Highest education level: Not on file  Occupational History  . Not on file  Social Needs  . Financial resource strain: Not on file  . Food insecurity:    Worry: Not on file    Inability: Not on file  . Transportation needs:    Medical: Not on file    Non-medical: Not on file  Tobacco Use  . Smoking status: Never Smoker  . Smokeless tobacco: Never Used  Substance and Sexual Activity  . Alcohol use: Yes    Alcohol/week: 2.4 oz    Types: 4 Standard drinks or equivalent per week    Comment: wine  . Drug use: No  . Sexual activity: Yes    Birth control/protection: Surgical  Lifestyle  . Physical activity:    Days per week: Not on file    Minutes per session: Not on file  . Stress: Not on file  Relationships  . Social connections:    Talks on phone: Not on file    Gets together: Not on file    Attends religious service: Not on file    Active member of club or organization: Not on file    Attends meetings of clubs or organizations: Not on file    Relationship status: Not on file  Other Topics Concern  . Not on file  Social History Narrative   Epworth Sleepiness Scale = 8 (as of 07/18/15)        Objective:  Physical Exam  Constitutional: She is oriented to person, place, and time. Vital signs are normal. She appears well-developed and well-nourished. She is active and cooperative. No distress.  BP (!) 144/84   Pulse 86   Temp 98.6 F (37 C)   Resp 16   Ht 5\' 4"  (1.626 m)   Wt 205 lb 6.4 oz (93.2 kg)   SpO2 94%   BMI 35.26 kg/m    HENT:  Head: Normocephalic and atraumatic.  Right Ear: Hearing, tympanic membrane, external ear and ear canal normal. No foreign bodies.  Left Ear: Hearing, tympanic membrane, external  ear and ear canal normal. No foreign bodies.  Nose: Nose normal.  Mouth/Throat: Uvula is midline, oropharynx is clear and moist and mucous membranes are normal. No oral lesions. Normal dentition. No dental abscesses or uvula swelling. No oropharyngeal exudate.  Eyes: Pupils are equal, round, and reactive to light. Conjunctivae, EOM and lids are normal. Right eye exhibits no discharge. Left eye exhibits no discharge. No scleral icterus.  Fundoscopic exam:      The right eye shows no arteriolar narrowing, no AV nicking, no exudate, no hemorrhage and no papilledema. The right eye shows red reflex.       The left eye shows no arteriolar narrowing, no AV nicking, no exudate, no hemorrhage and no papilledema. The left eye shows red reflex.  Neck: Trachea normal, normal range of motion and full passive range of motion without pain. Neck supple. No spinous process tenderness and no muscular tenderness present. No thyroid mass and no thyromegaly present.  Cardiovascular: Normal rate, regular rhythm, normal heart sounds, intact distal pulses and normal pulses.  Pulmonary/Chest: Effort normal and breath sounds normal. Right breast exhibits no inverted nipple, no mass, no nipple discharge, no skin change and no tenderness. Left breast exhibits no inverted nipple, no mass, no nipple discharge, no skin change and no tenderness. Breasts are symmetrical.  Musculoskeletal: She exhibits no edema or tenderness.       Cervical back: Normal.       Thoracic back: Normal.       Lumbar back: Normal.  Lymphadenopathy:       Head (right side): No tonsillar, no preauricular, no posterior auricular and no occipital adenopathy present.       Head (left side): No tonsillar, no preauricular, no posterior auricular and no occipital adenopathy present.    She has no cervical adenopathy.       Right: No supraclavicular adenopathy present.       Left: No supraclavicular adenopathy present.  Neurological: She is alert and oriented  to person, place, and time. She has normal strength and normal reflexes. No cranial nerve deficit. She exhibits normal muscle tone. Coordination and gait normal.  Skin: Skin is warm, dry and intact. No rash noted. She is not diaphoretic. No cyanosis or erythema. Nails show no clubbing.  Psychiatric: She has a normal mood and affect. Her speech is normal and behavior is normal. Judgment and thought content normal.     Wt Readings from Last 3 Encounters:  01/23/18 205 lb 6.4 oz (93.2 kg)  02/26/17 204 lb 9.6 oz (92.8 kg)  09/18/16 196 lb (88.9 kg)     Visual Acuity Screening   Right eye Left eye Both eyes  Without correction:     With correction: 20/40 20/40 20/70       Assessment & Plan:   Problem List Items Addressed This Visit    Osteopenia    T score of the left femoral neck is -2.30.  Recommend oral calcium and vitamin D.  Recommend weightbearing exercise.  Repeat DEXA in 05/2019.      Hyperlipidemia    Continue simvastatin 40 mg daily.  Encouraged healthy lifestyle changes.      Relevant Medications   benazepril (LOTENSIN) 10 MG tablet   simvastatin (ZOCOR) 40 MG tablet   Other Relevant Orders   Comprehensive metabolic panel (Completed)   Lipid panel (Completed)   HTN (hypertension)    Above goal, has run out of benazepril.  Resume.  Healthy lifestyle changes encouraged.      Relevant Medications   benazepril (LOTENSIN) 10 MG tablet   simvastatin (ZOCOR) 40 MG tablet   Other Relevant Orders   CBC with Differential/Platelet (Completed)   Comprehensive metabolic panel (Completed)   TSH (Completed)   Urinalysis, dipstick only (Completed)    Other Visit Diagnoses    Annual physical exam    -  Primary   Age-appropriate health guidance provided.   Stiffness of right hand joint       She declines additional evaluation and treatment today.   Fatigue, unspecified type       Check labs.  Likely due to style stress.  Encouraged self care.       Return in about 6  months (around 07/25/2018) for re-evalaution of blood pressure.  Fara Chute, PA-C [facility] [place]

## 2018-01-23 NOTE — Progress Notes (Signed)
Subjective:    Patient ID: Shelby Flores, female    DOB: 21-Oct-1945, 72 y.o.   MRN: [id number]  Shelby Flores is a 72 year old female presenting for her annual wellness exam. She is under a lot of stress with her job and needing to sell her house. She reports feeling excessively tired.  She also complains of a dull/sharp pain in her left ear that has been occurring for years. She sometimes will go to the urgent care for evaluation and will sometimes have an ear infection. She also has chronic myofacial pain in the right jaw. She has recently seen an oral surgeon (Dr. Alisa Graff) who reported a tight masseter muscle. He recommended botox, which she states she will pursue.   She also notes increased bilateral light green eye drainage along the medial canthi since the spring. She normally takes zyrtec and flonase but stopped the flonase about 3 weeks ago to see if it had been drying her out.   She also complains of arthralgia in her right hand, specifically in the pinky/ring fingers, for the past 8-9 months. The pain is always present and she is unable to make a complete fist on the affected side.   Notes a torn meniscus that was repaired about a year ago.  09/18/16 Labs LDL - 127 (high) TC - 204 (high) TG - 106  Presents for [employer]. Cervical Cancer Screening: make appointment with GYN Breast Cancer Screening: Recall diagnostic mammogram on 06/20/17: "The 53mm oval asymmetry in the left breast is indeterminate. Ultrasound to follow." Unremarkable results per patient per Dr. Isaiah Blakes. Colorectal Cancer Screening: colonoscopy 01/23/16 (one 35mm sessile polyp removed - repeat 5-10 years) Bone Density Testing: Dexa 05/2017 T scores:  R femur neck: -1.80 L femur neck: -2.30 R total femur: -0.70 L total femur: -1.80 AP total spine: -0.50 HIV Screening: low risk STI Screening: low risk Seasonal Influenza Vaccination: 09/18/16 Influenza,inj,Quad PF,6+ Mos Td/Tdap Vaccination:  01/18/14 Pneumococcal Vaccination: 09/18/16 Pneumococcal Polysaccharide-23 Zoster Vaccination: 09/18/17 Frequency of Dental evaluation: make appointment with dentist  Frequency of Eye evaluation: yearly     Review of Systems  Constitutional: Positive for fatigue ("tired for a while"). Negative for chills and fever.  HENT: Negative for congestion, hearing loss, rhinorrhea, sore throat and tinnitus.   Eyes: Positive for discharge (light green (medial canthi)). Negative for photophobia, pain and visual disturbance.  Respiratory: Negative for cough, chest tightness and shortness of breath.   Cardiovascular: Negative for chest pain and palpitations.  Gastrointestinal: Negative for abdominal pain, blood in stool, constipation, diarrhea, nausea and vomiting.  Endocrine: Negative for cold intolerance, heat intolerance and polyuria.  Genitourinary: Negative for dysuria, frequency, hematuria, pelvic pain, vaginal bleeding, vaginal discharge and vaginal pain.  Musculoskeletal: Positive for arthralgias (right pinky/ring fingers). Negative for joint swelling and neck stiffness.  Skin: Negative for color change and rash.  Neurological: Negative for dizziness, light-headedness, numbness and headaches.  Psychiatric/Behavioral: Negative for agitation and confusion. The patient is nervous/anxious (work/selling home).    Patient Active Problem List   Diagnosis Date Noted  . Osteopenia 07/31/2017  . Atypical chest pain 07/18/2015  . Essential tremor 10/18/2014  . Vasomotor rhinitis   . HTN (hypertension)   . Hyperlipidemia   . Obesity   . Myofascial muscle pain   . Personal history of colonic polyps    Prior to Admission medications   Medication Sig Start Date End Date Taking? Authorizing Provider  benazepril (LOTENSIN) 10 MG tablet Take 1 tablet (10 mg total) by  mouth daily. 01/23/18  Yes Jeffery, Chelle, PA-C  cetirizine (ZYRTEC) 10 MG tablet Take 10 mg by mouth as needed.    Yes [provider]  fluticasone (FLONASE) 50 MCG/ACT nasal spray Place 2 sprays into both nostrils daily. 02/26/17  Yes Jeffery, Chelle, PA-C  simvastatin (ZOCOR) 40 MG tablet Take 1 tablet (40 mg total) by mouth every evening. 01/23/18  Yes Jeffery, Chelle, PA-C   No Known Allergies     Objective:   Physical Exam  Constitutional: She is oriented to person, place, and time. She appears well-developed and well-nourished. No distress.  HENT:  Head: Normocephalic and atraumatic.  Right Ear: External ear normal.  Left Ear: External ear normal.  Mouth/Throat: Oropharynx is clear and moist.  Eyes: Pupils are equal, round, and reactive to light.  Neck: No thyromegaly present.  Cardiovascular: Normal rate, regular rhythm and normal heart sounds. Exam reveals no gallop and no friction rub.  No murmur heard. Pulmonary/Chest: Effort normal and breath sounds normal. No stridor. She has no wheezes. She has no rales.  Lymphadenopathy:    She has no cervical adenopathy.  Neurological: She is alert and oriented to person, place, and time.  Skin: Skin is warm and dry.  Psychiatric: She has a normal mood and affect.          Assessment & Plan:  1. Annual physical exam Discussed need to find coping mechanisms to help relieve/release stress and fatigue associated with work and life changes.  2. Hypertension, unspecified type Systolic elevated today (818); continue current regimen - CBC with Differential/Platelet - Comprehensive metabolic panel - TSH - Urinalysis, dipstick only  3. Hyperlipidemia, unspecified hyperlipidemia type Total cholesterol and LDL elevated; continue current regimen and reevaluate based on labs: - Comprehensive metabolic panel - Lipid panel - simvastatin (ZOCOR) 40 MG tablet; Take 1 tablet (40 mg total) by mouth every evening.  Dispense: 90 tablet; Refill: 3  4. Osteopenia, unspecified location Minor increased risk. Supplement with OTC calcium (goal of 1200mg  daily between dietary  intake and supplementation) and vitamin D (goal of 800U daily).  5. Myofascial muscle pain Follow up with Oral surgeon to start Botox treatment.  6. Stiffness of right hand joint May use periodic Alleve.   7. Benign essential HTN Systolic elevated today (299); continue current regimen - benazepril (LOTENSIN) 10 MG tablet; Take 1 tablet (10 mg total) by mouth daily.  Dispense: 90 tablet; Refill: 3  8. Fatigue, unspecified type See above "1" above.

## 2018-01-24 LAB — CBC WITH DIFFERENTIAL/PLATELET
BASOS: 0 %
Basophils Absolute: 0 10*3/uL (ref 0.0–0.2)
EOS (ABSOLUTE): 0.1 10*3/uL (ref 0.0–0.4)
EOS: 2 %
HEMATOCRIT: 42.2 % (ref 34.0–46.6)
Hemoglobin: 14.4 g/dL (ref 11.1–15.9)
IMMATURE GRANULOCYTES: 0 %
Immature Grans (Abs): 0 10*3/uL (ref 0.0–0.1)
LYMPHS ABS: 2 10*3/uL (ref 0.7–3.1)
Lymphs: 32 %
MCH: 29.4 pg (ref 26.6–33.0)
MCHC: 34.1 g/dL (ref 31.5–35.7)
MCV: 86 fL (ref 79–97)
MONOS ABS: 0.5 10*3/uL (ref 0.1–0.9)
Monocytes: 8 %
Neutrophils Absolute: 3.6 10*3/uL (ref 1.4–7.0)
Neutrophils: 58 %
Platelets: 327 10*3/uL (ref 150–450)
RBC: 4.89 x10E6/uL (ref 3.77–5.28)
RDW: 13.7 % (ref 12.3–15.4)
WBC: 6.2 10*3/uL (ref 3.4–10.8)

## 2018-01-24 LAB — URINALYSIS, DIPSTICK ONLY
BILIRUBIN UA: NEGATIVE
GLUCOSE, UA: NEGATIVE
Ketones, UA: NEGATIVE
Nitrite, UA: NEGATIVE
PROTEIN UA: NEGATIVE
RBC, UA: NEGATIVE
Specific Gravity, UA: 1.019 (ref 1.005–1.030)
Urobilinogen, Ur: 0.2 mg/dL (ref 0.2–1.0)
pH, UA: 7.5 (ref 5.0–7.5)

## 2018-01-24 LAB — COMPREHENSIVE METABOLIC PANEL
ALBUMIN: 4.2 g/dL (ref 3.5–4.8)
ALT: 38 IU/L — ABNORMAL HIGH (ref 0–32)
AST: 27 IU/L (ref 0–40)
Albumin/Globulin Ratio: 1.7 (ref 1.2–2.2)
Alkaline Phosphatase: 75 IU/L (ref 39–117)
BUN/Creatinine Ratio: 20 (ref 12–28)
BUN: 13 mg/dL (ref 8–27)
Bilirubin Total: 0.8 mg/dL (ref 0.0–1.2)
CALCIUM: 10.6 mg/dL — AB (ref 8.7–10.3)
CO2: 24 mmol/L (ref 20–29)
CREATININE: 0.65 mg/dL (ref 0.57–1.00)
Chloride: 104 mmol/L (ref 96–106)
GFR calc Af Amer: 104 mL/min/{1.73_m2} (ref >59)
GFR, EST NON AFRICAN AMERICAN: 90 mL/min/{1.73_m2} (ref >59)
GLOBULIN, TOTAL: 2.5 g/dL (ref 1.5–4.5)
Glucose: 105 mg/dL — ABNORMAL HIGH (ref 65–99)
Potassium: 5.1 mmol/L (ref 3.5–5.2)
SODIUM: 141 mmol/L (ref 134–144)
TOTAL PROTEIN: 6.7 g/dL (ref 6.0–8.5)

## 2018-01-24 LAB — LIPID PANEL
CHOL/HDL RATIO: 3.1 ratio (ref 0.0–4.4)
Cholesterol, Total: 169 mg/dL (ref 100–199)
HDL: 55 mg/dL (ref >39)
LDL CALC: 95 mg/dL (ref 0–99)
TRIGLYCERIDES: 96 mg/dL (ref 0–149)
VLDL Cholesterol Cal: 19 mg/dL (ref 5–40)

## 2018-01-24 LAB — TSH: TSH: 0.662 u[IU]/mL (ref 0.450–4.500)

## 2018-01-25 NOTE — Assessment & Plan Note (Signed)
T score of the left femoral neck is -2.30.  Recommend oral calcium and vitamin D.  Recommend weightbearing exercise.  Repeat DEXA in 05/2019.

## 2018-01-25 NOTE — Assessment & Plan Note (Signed)
Continue simvastatin 40 mg daily.  Encouraged healthy lifestyle changes.

## 2018-01-25 NOTE — Assessment & Plan Note (Signed)
Above goal, has run out of benazepril.  Resume.  Healthy lifestyle changes encouraged.

## 2018-01-27 ENCOUNTER — Encounter: Payer: Self-pay | Admitting: *Deleted

## 2019-02-19 ENCOUNTER — Other Ambulatory Visit: Payer: Self-pay | Admitting: Registered Nurse

## 2019-02-19 ENCOUNTER — Telehealth: Payer: Self-pay | Admitting: Physician Assistant

## 2019-02-19 DIAGNOSIS — I10 Essential (primary) hypertension: Secondary | ICD-10-CM

## 2019-02-19 MED ORDER — BENAZEPRIL HCL 10 MG PO TABS: 10.0000 mg | ORAL_TABLET | Freq: Every day | ORAL | 0 refills | Status: DC

## 2019-02-19 NOTE — Telephone Encounter (Signed)
Copied from [place] [id number]. Topic: Quick Communication - Rx Refill/Question >> Feb 19, 2019 10:26 AM Keene Breath wrote: Medication: benazepril (LOTENSIN) 10 MG tablet  Patient called to request a refill for the above, 3 months supply  Preferred Pharmacy (with phone number or street name): [facility] - [place], [place] [address] AT [address] [phone] (Phone) [phone] (Fax)

## 2019-02-19 NOTE — Progress Notes (Signed)
Pt requested refill - 3 mo supply given, patient will need appt for further refills, left vm discussing this.  Kathrin Ruddy, NP

## 2019-02-19 NOTE — Telephone Encounter (Incomplete)
Copied from [place] [id number]. Topic: Quick Communication - Rx Refill/Question >> Feb 19, 2019 10:26 AM Keene Breath wrote: Medication: ***benazepril (LOTENSIN) 10 MG tablet  Patient called to request a refill for the above, 3 months supply  Preferred Pharmacy (with phone number or street name): [facility] - [place], [place] [address] AT [address] [phone] (Phone) [phone] (Fax)

## 2019-02-23 NOTE — Telephone Encounter (Signed)
Please schduled appointment for pt, she needs a new pcp. 30 day supply was sent to pharmacy.

## 2019-02-23 NOTE — Telephone Encounter (Signed)
Rx was ordered by provided

## 2019-02-25 ENCOUNTER — Encounter: Payer: Self-pay | Admitting: Family Medicine

## 2019-02-25 ENCOUNTER — Ambulatory Visit (INDEPENDENT_AMBULATORY_CARE_PROVIDER_SITE_OTHER): Payer: Medicare Other | Admitting: Family Medicine

## 2019-02-25 ENCOUNTER — Other Ambulatory Visit: Payer: Self-pay

## 2019-02-25 VITALS — BP 140/83 | HR 82 | Temp 98.4°F | Ht 64.0 in | Wt 207.0 lb

## 2019-02-25 DIAGNOSIS — L237 Allergic contact dermatitis due to plants, except food: Secondary | ICD-10-CM | POA: Diagnosis not present

## 2019-02-25 MED ORDER — PREDNISONE 20 MG PO TABS: ORAL_TABLET | ORAL | 0 refills | Status: DC

## 2019-02-25 NOTE — Patient Instructions (Addendum)
Based on distribution of spread of the poison ivy, I think it is reasonable to start prednisone at this time.  Okay to continue calamine or Caladryl lotion to affected areas but avoid eyes nose or mouth.  For itching areas okay to use hydrocortisone over-the-counter.  If needed at bedtime okay to take a small dose of Benadryl to help with itching and sleep.  Return to the clinic or go to the nearest emergency room if any of your symptoms worsen or new symptoms occur.  If you have lab work done today you will be contacted with your lab results within the next 2 weeks.  If you have not heard from [place] then please contact us. The fastest way to get your results is to register for My Chart.   IF you received an x-ray today, you will receive an invoice from [facility]. Please contact [facility] at [phone] with questions or concerns regarding your invoice.   IF you received labwork today, you will receive an invoice from [place]. Please contact [facility] at [phone] with questions or concerns regarding your invoice.   Our billing staff will not be able to assist you with questions regarding bills from these companies.  You will be contacted with the lab results as soon as they are available. The fastest way to get your results is to activate your My Chart account. Instructions are located on the last page of this paperwork. If you have not heard from [place] regarding the results in 2 weeks, please contact this office.     Poison Ivy Dermatitis Poison ivy dermatitis is inflammation of the skin that is caused by chemicals in the leaves of the poison ivy plant. The skin reaction often involves redness, swelling, blisters, and extreme itching. What are the causes? This condition is caused by a chemical (urushiol) found in the sap of the poison ivy plant. This chemical is sticky and can be easily spread to people, animals, and objects. You can get poison ivy dermatitis by:  Having  direct contact with a poison ivy plant.  Touching animals, other people, or objects that have come in contact with poison ivy and have the chemical on them. What increases the risk? This condition is more likely to develop in people who:  Are outdoors often in wooded or [place] areas.  Go outdoors without wearing protective clothing, such as closed shoes, long pants, and a long-sleeved shirt. What are the signs or symptoms? Symptoms of this condition include:  Redness of the skin.  Extreme itching.  A rash that often includes bumps and blisters. The rash usually appears 48 hours after exposure, if you have been exposed before. If this is the first time you have been exposed, the rash may not appear until a week after exposure.  Swelling. This may occur if the reaction is more severe. Symptoms usually last for 1-2 weeks. However, the first time you develop this condition, symptoms may last 3-4 weeks. How is this diagnosed? This condition may be diagnosed based on your symptoms and a physical exam. Your health care provider may also ask you about any recent outdoor activity. How is this treated? Treatment for this condition will vary depending on how severe it is. Treatment may include:  Hydrocortisone cream or calamine lotion to relieve itching.  Oatmeal baths to soothe the skin.  Medicines, such as over-the-counter antihistamine tablets.  Oral steroid medicine, for more severe reactions. Follow these instructions at home: Medicines  Take or apply over-the-counter and prescription  medicines only as told by your health care provider.  Use hydrocortisone cream or calamine lotion as needed to soothe the skin and relieve itching. General instructions  Do not scratch or rub your skin.  Apply a cold, wet cloth (cold compress) to the affected areas or take baths in cool water. This will help with itching. Avoid hot baths and showers.  Take oatmeal baths as needed. Use colloidal  oatmeal. You can get this at your local pharmacy or grocery store. Follow the instructions on the packaging.  While you have the rash, wash clothes right after you wear them.  Keep all follow-up visits as told by your health care provider. This is important. How is this prevented?   Learn to identify the poison ivy plant and avoid contact with the plant. This plant can be recognized by the number of leaves. Generally, poison ivy has three leaves with flowering branches on a single stem. The leaves are typically glossy, and they have jagged edges that come to a point at the front.  If you have been exposed to poison ivy, thoroughly wash with soap and water right away. You have about 30 minutes to remove the plant resin before it will cause the rash. Be sure to wash under your fingernails, because any plant resin there will continue to spread the rash.  When hiking or camping, wear clothes that will help you to avoid exposure on the skin. This includes long pants, a long-sleeved shirt, tall socks, and hiking boots. You can also apply preventive lotion to your skin to help limit exposure.  If you suspect that your clothes or outdoor gear came in contact with poison ivy, rinse them off outside with a garden hose before you bring them inside your house.  When doing yard work or gardening, wear gloves, long sleeves, long pants, and boots. Wash your garden tools and gloves if they come in contact with poison ivy.  If you suspect that your pet has come into contact with poison ivy, wash him or her with pet shampoo and water. Make sure to wear gloves while washing your pet. Contact a health care provider if you have:  Open sores in the rash area.  More redness, swelling, or pain in the affected area.  Redness that spreads beyond the rash area.  Fluid, blood, or pus coming from the affected area.  A fever.  A rash over a large area of your body.  A rash on your eyes, mouth, or genitals.  A  rash that does not improve after a few weeks. Get help right away if:  Your face swells or your eyes swell shut.  You have trouble breathing.  You have trouble swallowing. These symptoms may represent a serious problem that is an emergency. Do not wait to see if the symptoms will go away. Get medical help right away. Call your local emergency services (911 in the [place] Do not drive yourself to the hospital. Summary  Poison ivy dermatitis is inflammation of the skin that is caused by chemicals in the leaves of the poison ivy plant.  Symptoms of this condition include redness, itching, a rash, and swelling.  Do not scratch or rub your skin.  Take or apply over-the-counter and prescription medicines only as told by your health care provider. This information is not intended to replace advice given to you by your health care provider. Make sure you discuss any questions you have with your health care provider. Document Released: 08/03/2000 Document Revised:  11/28/2018 Document Reviewed: 08/01/2018 Elsevier Patient Education  [place].

## 2019-02-25 NOTE — Progress Notes (Signed)
Subjective:    Patient ID: Shelby Flores, female    DOB: 1945-08-31, 73 y.o.   MRN: [id number]  HPI Shelby Flores is a 73 y.o. female Presents today for: Chief Complaint  Patient presents with  . Poison Ivy    using caldryl topical cream   3 days ago - working in yard - pulling out ivy, some poison ivy.   Cleansed with soap.  Started on left forearm next day - itchy/blistery rash.  Moved to left left arm, then on forehead and behind ear this am.  Slight irritation of R eye, but no rash.  No genital involvement.   Tx: caladryl lotion.   Glucose 105 last June, no history of diabetes.  No recent prednisone - has taken in past without difficulty.   Review of Systems Per HPI.      Objective:   Physical Exam Constitutional:      General: She is not in acute distress.    Appearance: She is well-developed.  HENT:     Head: Normocephalic and atraumatic.  Cardiovascular:     Rate and Rhythm: Normal rate.  Pulmonary:     Effort: Pulmonary effort is normal.  Skin:         Comments: Scattered patches of vesicles over volar forearms, some with linear distribution.  Patch over left forehead of similar clustered vesicles with erythematous base, small patch on chin, small patch behind left ear.  No lesions around eyes or mouth.  Neurological:     Mental Status: She is alert and oriented to person, place, and time.    Vitals:   02/25/19 1705  BP: 140/83  Pulse: 82  Temp: 98.4 F (36.9 C)  TempSrc: Oral  SpO2: 95%  Weight: 207 lb (93.9 kg)  Height: 5\' 4"  (1.626 m)       Assessment & Plan:    Shelby Flores is a 73 y.o. female Contact dermatitis due to poison ivy - Plan: predniSONE (DELTASONE) 20 MG tablet  -Diffuse distribution including to scalp, chin, and ear.  Spreading.  -Start prednisone, potential side effects and risks were discussed, taper discussed with potential need for more prolonged taper if recurrence or worsening symptoms.  -Okay to continue Caladryl,  Benadryl or Tylenol PM at night if needed, topical hydrocortisone over-the-counter if needed.  RTC precautions  Meds ordered this encounter  Medications  . predniSONE (DELTASONE) 20 MG tablet    Sig: 3 by mouth for 3 days, then 2 by mouth for 2 days, then 1 by mouth for 2 days, then 1/2 by mouth for 2 days.    Dispense:  16 tablet    Refill:  0   Patient Instructions    Based on distribution of spread of the poison ivy, I think it is reasonable to start prednisone at this time.  Okay to continue calamine or Caladryl lotion to affected areas but avoid eyes nose or mouth.  For itching areas okay to use hydrocortisone over-the-counter.  If needed at bedtime okay to take a small dose of Benadryl to help with itching and sleep.  Return to the clinic or go to the nearest emergency room if any of your symptoms worsen or new symptoms occur.  If you have lab work done today you will be contacted with your lab results within the next 2 weeks.  If you have not heard from [place] then please contact us. The fastest way to get your results is to register for My Chart.  IF you received an x-ray today, you will receive an invoice from [facility]. Please contact [facility] at [phone] with questions or concerns regarding your invoice.   IF you received labwork today, you will receive an invoice from [facility]. Please contact [facility] at [phone] with questions or concerns regarding your invoice.   Our billing staff will not be able to assist you with questions regarding bills from these companies.  You will be contacted with the lab results as soon as they are available. The fastest way to get your results is to activate your My Chart account. Instructions are located on the last page of this paperwork. If you have not heard from [place] regarding the results in 2 weeks, please contact this office.     Poison Ivy Dermatitis Poison ivy dermatitis is inflammation of the skin that is  caused by chemicals in the leaves of the poison ivy plant. The skin reaction often involves redness, swelling, blisters, and extreme itching. What are the causes? This condition is caused by a chemical (urushiol) found in the sap of the poison ivy plant. This chemical is sticky and can be easily spread to people, animals, and objects. You can get poison ivy dermatitis by:  Having direct contact with a poison ivy plant.  Touching animals, other people, or objects that have come in contact with poison ivy and have the chemical on them. What increases the risk? This condition is more likely to develop in people who:  Are outdoors often in wooded or [place] areas.  Go outdoors without wearing protective clothing, such as closed shoes, long pants, and a long-sleeved shirt. What are the signs or symptoms? Symptoms of this condition include:  Redness of the skin.  Extreme itching.  A rash that often includes bumps and blisters. The rash usually appears 48 hours after exposure, if you have been exposed before. If this is the first time you have been exposed, the rash may not appear until a week after exposure.  Swelling. This may occur if the reaction is more severe. Symptoms usually last for 1-2 weeks. However, the first time you develop this condition, symptoms may last 3-4 weeks. How is this diagnosed? This condition may be diagnosed based on your symptoms and a physical exam. Your health care provider may also ask you about any recent outdoor activity. How is this treated? Treatment for this condition will vary depending on how severe it is. Treatment may include:  Hydrocortisone cream or calamine lotion to relieve itching.  Oatmeal baths to soothe the skin.  Medicines, such as over-the-counter antihistamine tablets.  Oral steroid medicine, for more severe reactions. Follow these instructions at home: Medicines  Take or apply over-the-counter and prescription medicines only as told  by your health care provider.  Use hydrocortisone cream or calamine lotion as needed to soothe the skin and relieve itching. General instructions  Do not scratch or rub your skin.  Apply a cold, wet cloth (cold compress) to the affected areas or take baths in cool water. This will help with itching. Avoid hot baths and showers.  Take oatmeal baths as needed. Use colloidal oatmeal. You can get this at your local pharmacy or grocery store. Follow the instructions on the packaging.  While you have the rash, wash clothes right after you wear them.  Keep all follow-up visits as told by your health care provider. This is important. How is this prevented?   Learn to identify the poison ivy plant and avoid contact with  the plant. This plant can be recognized by the number of leaves. Generally, poison ivy has three leaves with flowering branches on a single stem. The leaves are typically glossy, and they have jagged edges that come to a point at the front.  If you have been exposed to poison ivy, thoroughly wash with soap and water right away. You have about 30 minutes to remove the plant resin before it will cause the rash. Be sure to wash under your fingernails, because any plant resin there will continue to spread the rash.  When hiking or camping, wear clothes that will help you to avoid exposure on the skin. This includes long pants, a long-sleeved shirt, tall socks, and hiking boots. You can also apply preventive lotion to your skin to help limit exposure.  If you suspect that your clothes or outdoor gear came in contact with poison ivy, rinse them off outside with a garden hose before you bring them inside your house.  When doing yard work or gardening, wear gloves, long sleeves, long pants, and boots. Wash your garden tools and gloves if they come in contact with poison ivy.  If you suspect that your pet has come into contact with poison ivy, wash him or her with pet shampoo and water. Make  sure to wear gloves while washing your pet. Contact a health care provider if you have:  Open sores in the rash area.  More redness, swelling, or pain in the affected area.  Redness that spreads beyond the rash area.  Fluid, blood, or pus coming from the affected area.  A fever.  A rash over a large area of your body.  A rash on your eyes, mouth, or genitals.  A rash that does not improve after a few weeks. Get help right away if:  Your face swells or your eyes swell shut.  You have trouble breathing.  You have trouble swallowing. These symptoms may represent a serious problem that is an emergency. Do not wait to see if the symptoms will go away. Get medical help right away. Call your local emergency services (911 in the [place] Do not drive yourself to the hospital. Summary  Poison ivy dermatitis is inflammation of the skin that is caused by chemicals in the leaves of the poison ivy plant.  Symptoms of this condition include redness, itching, a rash, and swelling.  Do not scratch or rub your skin.  Take or apply over-the-counter and prescription medicines only as told by your health care provider. This information is not intended to replace advice given to you by your health care provider. Make sure you discuss any questions you have with your health care provider. Document Released: 08/03/2000 Document Revised: 11/28/2018 Document Reviewed: 08/01/2018 Elsevier Patient Education  2020 [employer].   Signed,   Merri Ray, MD [facility].  02/25/19 6:48 PM

## 2019-04-15 ENCOUNTER — Encounter: Payer: Medicare Other | Admitting: Registered Nurse

## 2019-04-17 ENCOUNTER — Other Ambulatory Visit: Payer: Self-pay

## 2019-04-17 ENCOUNTER — Ambulatory Visit (INDEPENDENT_AMBULATORY_CARE_PROVIDER_SITE_OTHER): Payer: Medicare Other | Admitting: Family Medicine

## 2019-04-17 ENCOUNTER — Encounter: Payer: Self-pay | Admitting: Family Medicine

## 2019-04-17 VITALS — BP 125/85 | HR 79 | Temp 97.8°F | Resp 17 | Ht 64.0 in | Wt 203.0 lb

## 2019-04-17 DIAGNOSIS — Z23 Encounter for immunization: Secondary | ICD-10-CM | POA: Diagnosis not present

## 2019-04-17 DIAGNOSIS — M7918 Myalgia, other site: Secondary | ICD-10-CM

## 2019-04-17 DIAGNOSIS — I10 Essential (primary) hypertension: Secondary | ICD-10-CM | POA: Diagnosis not present

## 2019-04-17 DIAGNOSIS — E785 Hyperlipidemia, unspecified: Secondary | ICD-10-CM | POA: Diagnosis not present

## 2019-04-17 DIAGNOSIS — Z1329 Encounter for screening for other suspected endocrine disorder: Secondary | ICD-10-CM

## 2019-04-17 MED ORDER — SHINGRIX 50 MCG/0.5ML IM SUSR: 0.5000 mL | Freq: Once | INTRAMUSCULAR | 1 refills | Status: AC

## 2019-04-17 MED ORDER — BENAZEPRIL HCL 10 MG PO TABS: 10.0000 mg | ORAL_TABLET | Freq: Every day | ORAL | 1 refills | Status: DC
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
## 2019-04-17 NOTE — Patient Instructions (Addendum)
  Please return for fasting labs prior to physical in few weeks. Can discuss labs once those.   Ok to continue saline nasal spray, flonase and zyrtec if needed.   No change in benazepril.   1200-1500 calcium per day and 800 units of Vitamin D. Repeat bone density due in late October.   I will watch for update on your jaw issue from specialist eval.  Ideally would like to see less NSAID use, but I can refer you to [facility] for other opinion if needed.   If you have lab work done today you will be contacted with your lab results within the next 2 weeks.  If you have not heard from [place] then please contact us. The fastest way to get your results is to register for My Chart.   IF you received an x-ray today, you will receive an invoice from [facility] Radiology. Please contact [facility] Radiology at [phone] with questions or concerns regarding your invoice.   IF you received labwork today, you will receive an invoice from [place]. Please contact [facility] at [phone] with questions or concerns regarding your invoice.   Our billing staff will not be able to assist you with questions regarding bills from these companies.  You will be contacted with the lab results as soon as they are available. The fastest way to get your results is to activate your My Chart account. Instructions are located on the last page of this paperwork. If you have not heard from [place] regarding the results in 2 weeks, please contact this office.

## 2019-04-18 ENCOUNTER — Encounter: Payer: Self-pay | Admitting: Family Medicine

## 2019-04-28 ENCOUNTER — Ambulatory Visit: Payer: Medicare Other

## 2019-04-29 ENCOUNTER — Ambulatory Visit (INDEPENDENT_AMBULATORY_CARE_PROVIDER_SITE_OTHER): Payer: Medicare Other | Admitting: Family Medicine

## 2019-04-29 ENCOUNTER — Other Ambulatory Visit: Payer: Self-pay

## 2019-04-29 DIAGNOSIS — Z1329 Encounter for screening for other suspected endocrine disorder: Secondary | ICD-10-CM

## 2019-04-29 DIAGNOSIS — E785 Hyperlipidemia, unspecified: Secondary | ICD-10-CM

## 2019-04-29 DIAGNOSIS — I10 Essential (primary) hypertension: Secondary | ICD-10-CM

## 2019-04-30 LAB — COMPREHENSIVE METABOLIC PANEL
ALT: 19 IU/L (ref 0–32)
AST: 13 IU/L (ref 0–40)
Albumin/Globulin Ratio: 1.8 (ref 1.2–2.2)
Albumin: 4 g/dL (ref 3.7–4.7)
Alkaline Phosphatase: 58 IU/L (ref 39–117)
BUN/Creatinine Ratio: 23 (ref 12–28)
BUN: 19 mg/dL (ref 8–27)
Bilirubin Total: 1.1 mg/dL (ref 0.0–1.2)
CO2: 25 mmol/L (ref 20–29)
Calcium: 10.3 mg/dL (ref 8.7–10.3)
Chloride: 106 mmol/L (ref 96–106)
Creatinine, Ser: 0.81 mg/dL (ref 0.57–1.00)
GFR calc Af Amer: 84 mL/min/{1.73_m2} (ref >59)
GFR calc non Af Amer: 73 mL/min/{1.73_m2} (ref >59)
Globulin, Total: 2.2 g/dL (ref 1.5–4.5)
Glucose: 108 mg/dL — ABNORMAL HIGH (ref 65–99)
Potassium: 4.7 mmol/L (ref 3.5–5.2)
Sodium: 142 mmol/L (ref 134–144)
Total Protein: 6.2 g/dL (ref 6.0–8.5)

## 2019-04-30 LAB — TSH: TSH: 1.12 u[IU]/mL (ref 0.450–4.500)

## 2019-04-30 LAB — LIPID PANEL
Chol/HDL Ratio: 4.2 ratio (ref 0.0–4.4)
Cholesterol, Total: 259 mg/dL — ABNORMAL HIGH (ref 100–199)
HDL: 61 mg/dL (ref >39)
LDL Chol Calc (NIH): 177 mg/dL — ABNORMAL HIGH (ref 0–99)
Triglycerides: 116 mg/dL (ref 0–149)
VLDL Cholesterol Cal: 21 mg/dL (ref 5–40)

## 2019-05-04 ENCOUNTER — Encounter: Payer: Medicare Other | Admitting: Family Medicine

## 2019-05-05 ENCOUNTER — Encounter: Payer: Self-pay | Admitting: Radiology

## 2019-05-18 ENCOUNTER — Other Ambulatory Visit: Payer: Self-pay | Admitting: Registered Nurse

## 2019-05-18 DIAGNOSIS — I10 Essential (primary) hypertension: Secondary | ICD-10-CM

## 2019-08-26 ENCOUNTER — Encounter: Payer: Self-pay | Admitting: Family Medicine

## 2019-08-26 LAB — HM MAMMOGRAPHY

## 2019-09-10 ENCOUNTER — Ambulatory Visit: Payer: Medicare Other | Attending: Internal Medicine

## 2019-09-10 DIAGNOSIS — Z23 Encounter for immunization: Secondary | ICD-10-CM

## 2019-09-10 NOTE — Progress Notes (Signed)
   Covid-19 Vaccination Clinic  Name:  Shelby Flores    MRN: [id number] DOB: Dec 29, 1945  09/10/2019  [name] was observed post Covid-19 immunization for 15 minutes without incidence. She was provided with Vaccine Information Sheet and instruction to access the V-Safe system.   [name] was instructed to call 911 with any severe reactions post vaccine: Marland Kitchen Difficulty breathing  . Swelling of your face and throat  . A fast heartbeat  . A bad rash all over your body  . Dizziness and weakness    Immunizations Administered    Name Date Dose VIS Date Route   Pfizer COVID-19 Vaccine 09/10/2019  4:13 PM 0.3 mL 07/31/2019 Intramuscular   Manufacturer: [place]   Lot: [id number]   [place]: [id number]

## 2019-10-01 ENCOUNTER — Ambulatory Visit: Payer: Medicare Other | Attending: Internal Medicine

## 2019-10-01 DIAGNOSIS — Z23 Encounter for immunization: Secondary | ICD-10-CM | POA: Insufficient documentation

## 2019-10-01 NOTE — Progress Notes (Signed)
   Covid-19 Vaccination Clinic  Name:  Shelby Flores    MRN: [id number] DOB: [birth year]  10/01/2019  [name] was observed post Covid-19 immunization for 15 minutes without incidence. She was provided with Vaccine Information Sheet and instruction to access the V-Safe system.   [name] was instructed to call 911 with any severe reactions post vaccine: Marland Kitchen Difficulty breathing  . Swelling of your face and throat  . A fast heartbeat  . A bad rash all over your body  . Dizziness and weakness    Immunizations Administered    Name Date Dose VIS Date Route   Pfizer COVID-19 Vaccine 10/01/2019 10:08 AM 0.3 mL 07/31/2019 Intramuscular   Manufacturer: [place]   Lot: [id number]   [place]: [id number]

## 2020-01-15 ENCOUNTER — Other Ambulatory Visit: Payer: Self-pay | Admitting: Family Medicine

## 2020-01-15 DIAGNOSIS — I10 Essential (primary) hypertension: Secondary | ICD-10-CM

## 2020-01-15 NOTE — Telephone Encounter (Signed)
Requested medication (s) are due for refill today: yes  Requested medication (s) are on the active medication list: yes  Last refill: 08/12/20  Future visit scheduled: no  Notes to clinic:  no valid encounter within last 6 months    Requested Prescriptions  Pending Prescriptions Disp Refills   benazepril (LOTENSIN) 10 MG tablet [Pharmacy Med Name: BENAZEPRIL 10MG  TABLETS] 90 tablet 1    Sig: TAKE 1 TABLET(10 MG) BY MOUTH DAILY      Cardiovascular:  ACE Inhibitors Failed - 01/15/2020  3:32 AM      Failed - Cr in normal range and within 180 days    Creat  Date Value Ref Range Status  06/22/2015 0.77 0.50 - 0.99 mg/dL Final   Creatinine, Ser  Date Value Ref Range Status  04/29/2019 0.81 0.57 - 1.00 mg/dL Final          Failed - K in normal range and within 180 days    Potassium  Date Value Ref Range Status  04/29/2019 4.7 3.5 - 5.2 mmol/L Final          Failed - Valid encounter within last 6 months    Recent Outpatient Visits           8 months ago Screening for thyroid disorder   [facility] [place], MD   9 months ago Hyperlipidemia, unspecified hyperlipidemia type   [facility] at [place], MD   10 months ago Contact dermatitis due to poison ivy   [facility] [place], Ranell Patrick, MD   1 year ago Annual physical exam   Primary Care at [facility], [place], [place]   2 years ago Subacute maxillary sinusitis   [facility] at [place], [place], [place] - Patient is not pregnant      Passed - Last BP in normal range    BP Readings from Last 1 Encounters:  04/17/19 125/85

## 2020-06-28 ENCOUNTER — Other Ambulatory Visit: Payer: Self-pay | Admitting: Family Medicine

## 2020-06-28 DIAGNOSIS — I10 Essential (primary) hypertension: Secondary | ICD-10-CM

## 2020-06-28 NOTE — Telephone Encounter (Signed)
Medication Refill - Medication: benazepril (LOTENSIN) 10 MG tablet (Patient requesting 90 day supply)   Has the patient contacted their pharmacy? Yes (Agent: If no, request that the patient contact the pharmacy for the refill.) (Agent: If yes, when and what did the pharmacy advise?)Contact PCP  Preferred Pharmacy (with phone number or street name):  [facility] - [place], [place] - [address] Phone:  [phone]  Fax:  [phone]       Agent: Please be advised that RX refills may take up to 3 business days. We ask that you follow-up with your pharmacy.

## 2020-06-28 NOTE — Telephone Encounter (Signed)
Needs appt then can do curtesy

## 2020-06-28 NOTE — Telephone Encounter (Signed)
Requested medication (s) are due for refill today: Yes  Requested medication (s) are on the active medication list: Yes  Last refill:  05/18/19  Future visit scheduled: No  Notes to clinic:  Prescription has expired. Pt. Needs appointment.    Requested Prescriptions  Pending Prescriptions Disp Refills   benazepril (LOTENSIN) 10 MG tablet 90 tablet 1    Sig: TAKE 1 TABLET(10 MG) BY MOUTH DAILY      Cardiovascular:  ACE Inhibitors Failed - 06/28/2020 12:44 PM      Failed - Cr in normal range and within 180 days    Creat  Date Value Ref Range Status  06/22/2015 0.77 0.50 - 0.99 mg/dL Final   Creatinine, Ser  Date Value Ref Range Status  04/29/2019 0.81 0.57 - 1.00 mg/dL Final          Failed - K in normal range and within 180 days    Potassium  Date Value Ref Range Status  04/29/2019 4.7 3.5 - 5.2 mmol/L Final          Failed - Valid encounter within last 6 months    Recent Outpatient Visits           1 year ago Screening for thyroid disorder   [facility] [place], Ranell Patrick, MD   1 year ago Hyperlipidemia, unspecified hyperlipidemia type   [facility] at [place], Ranell Patrick, MD   1 year ago Contact dermatitis due to poison ivy   [facility] at [place], Ranell Patrick, MD   2 years ago Annual physical exam   Primary Care at [facility], [place], [place]   3 years ago Subacute maxillary sinusitis   Primary Care at [place], [place], [place] - Patient is not pregnant      Passed - Last BP in normal range    BP Readings from Last 1 Encounters:  04/17/19 125/85

## 2020-06-30 NOTE — Telephone Encounter (Signed)
06/30/2020 - PATIENT REQUESTING A REFILL ON HER BENAZEPRIL 10 mg. I HAVE SCHEDULED AN OFFICE VISIT WITH DR. Carlota Raspberry ON Wednesday (07/06/2020) AT 2:20 pm. I DO NOT HAVE TO ROUTE BACK TO THE CLINICAL TEAM AT THIS TIME BECAUSE PATIENT HAS ENOUGH PILLS TO LAST HER UNTIL HER APPOINTMENT WITH DR. Carlota Raspberry. [place]

## 2020-07-06 ENCOUNTER — Other Ambulatory Visit: Payer: Self-pay

## 2020-07-06 ENCOUNTER — Encounter: Payer: Self-pay | Admitting: Family Medicine

## 2020-07-06 ENCOUNTER — Ambulatory Visit (INDEPENDENT_AMBULATORY_CARE_PROVIDER_SITE_OTHER): Payer: Medicare Other | Admitting: Family Medicine

## 2020-07-06 VITALS — BP 125/85 | HR 94 | Temp 98.5°F | Ht 64.0 in | Wt 201.0 lb

## 2020-07-06 DIAGNOSIS — R519 Headache, unspecified: Secondary | ICD-10-CM | POA: Diagnosis not present

## 2020-07-06 DIAGNOSIS — E785 Hyperlipidemia, unspecified: Secondary | ICD-10-CM

## 2020-07-06 DIAGNOSIS — M85852 Other specified disorders of bone density and structure, left thigh: Secondary | ICD-10-CM

## 2020-07-06 DIAGNOSIS — R6884 Jaw pain: Secondary | ICD-10-CM | POA: Diagnosis not present

## 2020-07-06 DIAGNOSIS — I10 Essential (primary) hypertension: Secondary | ICD-10-CM

## 2020-07-06 DIAGNOSIS — G8929 Other chronic pain: Secondary | ICD-10-CM

## 2020-07-06 DIAGNOSIS — M85851 Other specified disorders of bone density and structure, right thigh: Secondary | ICD-10-CM

## 2020-07-06 MED ORDER — BENAZEPRIL HCL 10 MG PO TABS: 10.0000 mg | ORAL_TABLET | Freq: Every day | ORAL | 2 refills | Status: DC

## 2020-07-06 NOTE — Patient Instructions (Addendum)
   Fasting lab in next 2 weeks, and we can look at cholesterol numbers and options at that time.   Make sure you are getting 1200-1500mg  calcium per day and [phone] units vitamin D per day.   No med changes at this time.   I will refer you to neurology to discuss jaw pain and possible trigeminal neuralgia.   Return to the clinic or go to the nearest emergency room if any of your symptoms worsen or new symptoms occur.   If you have lab work done today you will be contacted with your lab results within the next 2 weeks.  If you have not heard from [place] then please contact us. The fastest way to get your results is to register for My Chart.   IF you received an x-ray today, you will receive an invoice from [facility] Radiology. Please contact [facility] Radiology at [phone] with questions or concerns regarding your invoice.   IF you received labwork today, you will receive an invoice from [place]. Please contact [facility] at [phone] with questions or concerns regarding your invoice.   Our billing staff will not be able to assist you with questions regarding bills from these companies.  You will be contacted with the lab results as soon as they are available. The fastest way to get your results is to activate your My Chart account. Instructions are located on the last page of this paperwork. If you have not heard from [place] regarding the results in 2 weeks, please contact this office.

## 2020-07-06 NOTE — Progress Notes (Signed)
Subjective:  Patient ID: Shelby Flores, female    DOB: Dec 14, 1945  Age: 74 y.o. MRN: [id number]  CC:  Chief Complaint  Patient presents with  . Medication Refill    on Lotensin. pt reports this medication works well for her with no side effects. Pt would like ptovider to review her bone density scan with her.  . Jaw Pain    pt reports having chronic jaw pain for the past 20+ years. pt states she has been seen for this before and been told its TMJ and other diagnosis's pt is wonderif if she should see a specalist for this. PT reports no treatment she has gotten for this hasn't helped.    HPI Shelby Flores presents for   Hypertension: Lotensin 10 mg daily Home readings:none, no new side effects.  Not fasting today.  BP Readings from Last 3 Encounters:  07/06/20 125/85  04/17/19 125/85  02/25/19 140/83   Lab Results  Component Value Date   CREATININE 0.81 04/29/2019   Hyperlipidemia: No current medications, LDL elevated in September 2020.  Side effects on statin previously, plan for diet/activity exercise and repeat testing in 3 months. No changes in diet/exercise.  The 10-year ASCVD risk score Mikey Bussing DC Brooke Bonito., et al., 2013) is: 17.1%   Values used to calculate the score:     Age: 68 years     Sex: Female     Is Non-Hispanic African American: No     Diabetic: No     Tobacco smoker: No     Systolic Blood Pressure: 712 mmHg     Is BP treated: Yes     HDL Cholesterol: 61 mg/dL     Total Cholesterol: 259 mg/dL  Lab Results  Component Value Date   CHOL 259 (H) 04/29/2019   HDL 61 04/29/2019   LDLCALC 177 (H) 04/29/2019   TRIG 116 04/29/2019   CHOLHDL 4.2 04/29/2019   Lab Results  Component Value Date   ALT 19 04/29/2019   AST 13 04/29/2019   ALKPHOS 58 04/29/2019   BILITOT 1.1 04/29/2019   Osteopenia Last bone density performed 08/26/2019.  T score -2.40 at the left femoral neck, change from -2.30 in October 2018.  Bone density reported stable at all sites  measured. Calcium - one a day vitamin..   Chronic jaw pain 20 years.  Has been told that she has had TMJ syndrome in the past, symptoms have been present for many years. Chronic left jaw pain.  Mom had dystonia, but she does not have dystonia. Pain in left jaw requiring tyelnol and alleve daily. 2-3 tylenol per day, 1-2 alleve per day.  Has discussed with dentist. Has tried bite guards- made worse.  Saw Dr. Dorene Sorrow - oral surgeon. Tried botox injections. No relief.  Saw ENT. No changes.  Had braces - no change,  Referred to Integrative therapies - physical therapy and biofeedback - no changes. ? Trigeminal  neuralgia. No formal diagnosis. Has tried acupuncture, massage and upper cervical chiropractor for a year. Still there.  Pain moves down jaw - radiating pain, can also refer pain to above eye/forehead, stabbing pain in ear, sometimes under eye, sometimes base of skull.   History Patient Active Problem List   Diagnosis Date Noted  . Osteopenia 07/31/2017  . Atypical chest pain 07/18/2015  . Essential tremor 10/18/2014  . Vasomotor rhinitis   . HTN (hypertension)   . Hyperlipidemia   . Obesity   . Myofascial muscle pain   .  Personal history of colonic polyps    Past Medical History:  Diagnosis Date  . Allergy   . Atypical chest pain 07/18/2015  . HTN (hypertension)   . Hyperlipidemia   . Myofacial muscle pain    FACIAL  . Obesity   . Personal history of colonic polyps 2009   Repeat colonoscopy 2014  . Vasomotor rhinitis    Past Surgical History:  Procedure Laterality Date  . ABDOMINAL HYSTERECTOMY  1994   PARTIAL-fibroids  . CHOLECYSTECTOMY  2002  . GANGLION CYST EXCISION  2002   lt hand   No Known Allergies Prior to Admission medications   Medication Sig Start Date End Date Taking? Authorizing Provider  benazepril (LOTENSIN) 10 MG tablet TAKE 1 TABLET(10 MG) BY MOUTH DAILY 05/18/19  Yes Wendie Agreste, MD   Social History   Socioeconomic History  .  Marital status: Married    Spouse name: Not on file  . Number of children: Not on file  . Years of education: Not on file  . Highest education level: Not on file  Occupational History  . Not on file  Tobacco Use  . Smoking status: Never Smoker  . Smokeless tobacco: Never Used  Vaping Use  . Vaping Use: Never used  Substance and Sexual Activity  . Alcohol use: Yes    Alcohol/week: 4.0 standard drinks    Types: 4 Standard drinks or equivalent per week    Comment: wine  . Drug use: No  . Sexual activity: Yes    Birth control/protection: Surgical  Other Topics Concern  . Not on file  Social History Narrative   Epworth Sleepiness Scale = 8 (as of 07/18/15)   Social Determinants of Health   Financial Resource Strain:   . Difficulty of Paying Living Expenses: Not on file  Food Insecurity:   . Worried About Charity fundraiser in the Last Year: Not on file  . Ran Out of Food in the Last Year: Not on file  Transportation Needs:   . Lack of Transportation (Medical): Not on file  . Lack of Transportation (Non-Medical): Not on file  Physical Activity:   . Days of Exercise per Week: Not on file  . Minutes of Exercise per Session: Not on file  Stress:   . Feeling of Stress : Not on file  Social Connections:   . Frequency of Communication with Friends and Family: Not on file  . Frequency of Social Gatherings with Friends and Family: Not on file  . Attends Religious Services: Not on file  . Active Member of Clubs or Organizations: Not on file  . Attends Archivist Meetings: Not on file  . Marital Status: Not on file  Intimate Partner Violence:   . Fear of Current or Ex-Partner: Not on file  . Emotionally Abused: Not on file  . Physically Abused: Not on file  . Sexually Abused: Not on file    Review of Systems  Constitutional: Negative for fatigue and unexpected weight change.  Respiratory: Negative for chest tightness and shortness of breath.   Cardiovascular:  Negative for chest pain, palpitations and leg swelling.  Gastrointestinal: Negative for abdominal pain and blood in stool.  Neurological: Negative for dizziness, syncope, light-headedness and headaches.     Objective:   Vitals:   07/06/20 1429  BP: 125/85  Pulse: 94  Temp: 98.5 F (36.9 C)  TempSrc: Temporal  SpO2: 95%  Weight: 201 lb (91.2 kg)  Height: 5\' 4"  (1.626 m)  Physical Exam Vitals reviewed.  Constitutional:      Appearance: She is well-developed.  HENT:     Head: Normocephalic and atraumatic.  Eyes:     Conjunctiva/sclera: Conjunctivae normal.     Pupils: Pupils are equal, round, and reactive to light.  Neck:     Vascular: No carotid bruit.  Cardiovascular:     Rate and Rhythm: Normal rate and regular rhythm.     Heart sounds: Normal heart sounds.  Pulmonary:     Effort: Pulmonary effort is normal.     Breath sounds: Normal breath sounds.  Abdominal:     Palpations: Abdomen is soft. There is no pulsatile mass.     Tenderness: There is no abdominal tenderness.  Skin:    General: Skin is warm and dry.  Neurological:     Mental Status: She is alert and oriented to person, place, and time.  Psychiatric:        Behavior: Behavior normal.      34 minutes spent during visit, greater than 50% counseling and assimilation of information, chart review, and discussion of plan.    Assessment & Plan:  Shelby Flores is a 74 y.o. female . Hypertension, unspecified type - Plan: Comprehensive metabolic panel, benazepril (LOTENSIN) 10 MG tablet  -  Stable, tolerating current regimen. Medications refilled. Labs pending as above.   Osteopenia of necks of both femurs  -Osteopenia but close to level of osteoporosis.  Stressed importance of calcium and vitamin D and recommended dosages.  Repeat bone density in 2 years but did review findings with her from testing earlier this year.  Chronic face pain - Plan: Ambulatory referral to Neurology Jaw pain - Plan:  Ambulatory referral to Neurology  -See above, has had multiple evaluations with specialist over the years, chronic issue.  Differential does possibly include trigeminal neuralgia but atypical symptoms.  Did recommend neurology eval to decide if further testing indicated or trial of med such as TCA or gabapentin.  Referred to [facility] neurology at her request.  Hyperlipidemia, unspecified hyperlipidemia type - Plan: Comprehensive metabolic panel, Lipid panel  -Check fasting labs then can decide on ASCVD risk score and potential statin recommendation.  May have had some difficulty with statins previously, but can discuss those symptoms further once labs reviewed.  Meds ordered this encounter  Medications  . benazepril (LOTENSIN) 10 MG tablet    Sig: Take 1 tablet (10 mg total) by mouth daily.    Dispense:  90 tablet    Refill:  2   Patient Instructions     Fasting lab in next 2 weeks, and we can look at cholesterol numbers and options at that time.   Make sure you are getting 1200-1500mg  calcium per day and [phone] units vitamin D per day.   No med changes at this time.   I will refer you to neurology to discuss jaw pain and possible trigeminal neuralgia.   Return to the clinic or go to the nearest emergency room if any of your symptoms worsen or new symptoms occur.   If you have lab work done today you will be contacted with your lab results within the next 2 weeks.  If you have not heard from [place] then please contact us. The fastest way to get your results is to register for My Chart.   IF you received an x-ray today, you will receive an invoice from [facility]. Please contact [facility] at [phone] with questions or concerns regarding your  invoice.   IF you received labwork today, you will receive an invoice from [facility]. Please contact [facility] at [phone] with questions or concerns regarding your invoice.   Our billing staff will not be able to  assist you with questions regarding bills from these companies.  You will be contacted with the lab results as soon as they are available. The fastest way to get your results is to activate your My Chart account. Instructions are located on the last page of this paperwork. If you have not heard from [place] regarding the results in 2 weeks, please contact this office.         Signed, Merri Ray, MD [facility]

## 2020-07-07 ENCOUNTER — Encounter: Payer: Self-pay | Admitting: Family Medicine

## 2020-07-12 ENCOUNTER — Ambulatory Visit (INDEPENDENT_AMBULATORY_CARE_PROVIDER_SITE_OTHER): Payer: Medicare Other | Admitting: Emergency Medicine

## 2020-07-12 ENCOUNTER — Other Ambulatory Visit: Payer: Self-pay

## 2020-07-12 DIAGNOSIS — I10 Essential (primary) hypertension: Secondary | ICD-10-CM

## 2020-07-12 DIAGNOSIS — E785 Hyperlipidemia, unspecified: Secondary | ICD-10-CM

## 2020-07-12 LAB — LIPID PANEL

## 2020-07-13 LAB — COMPREHENSIVE METABOLIC PANEL
ALT: 41 IU/L — ABNORMAL HIGH (ref 0–32)
AST: 28 IU/L (ref 0–40)
Albumin/Globulin Ratio: 1.9 (ref 1.2–2.2)
Albumin: 4.4 g/dL (ref 3.7–4.7)
Alkaline Phosphatase: 74 IU/L (ref 44–121)
BUN/Creatinine Ratio: 17 (ref 12–28)
BUN: 12 mg/dL (ref 8–27)
Bilirubin Total: 0.8 mg/dL (ref 0.0–1.2)
CO2: 24 mmol/L (ref 20–29)
Calcium: 10.3 mg/dL (ref 8.7–10.3)
Chloride: 102 mmol/L (ref 96–106)
Creatinine, Ser: 0.71 mg/dL (ref 0.57–1.00)
GFR calc Af Amer: 98 mL/min/{1.73_m2} (ref >59)
GFR calc non Af Amer: 85 mL/min/{1.73_m2} (ref >59)
Globulin, Total: 2.3 g/dL (ref 1.5–4.5)
Glucose: 109 mg/dL — ABNORMAL HIGH (ref 65–99)
Potassium: 4.8 mmol/L (ref 3.5–5.2)
Sodium: 140 mmol/L (ref 134–144)
Total Protein: 6.7 g/dL (ref 6.0–8.5)

## 2020-07-13 LAB — LIPID PANEL
Chol/HDL Ratio: 5.1 ratio — ABNORMAL HIGH (ref 0.0–4.4)
Cholesterol, Total: 273 mg/dL — ABNORMAL HIGH (ref 100–199)
HDL: 54 mg/dL (ref >39)
LDL Chol Calc (NIH): 166 mg/dL — ABNORMAL HIGH (ref 0–99)
Triglycerides: 284 mg/dL — ABNORMAL HIGH (ref 0–149)
VLDL Cholesterol Cal: 53 mg/dL — ABNORMAL HIGH (ref 5–40)

## 2020-07-16 ENCOUNTER — Encounter: Payer: Self-pay | Admitting: Family Medicine

## 2020-07-19 ENCOUNTER — Other Ambulatory Visit: Payer: Self-pay | Admitting: Family Medicine

## 2020-07-19 DIAGNOSIS — E785 Hyperlipidemia, unspecified: Secondary | ICD-10-CM

## 2020-07-19 MED ORDER — SIMVASTATIN 10 MG PO TABS: 10.0000 mg | ORAL_TABLET | Freq: Every day | ORAL | 1 refills | Status: DC

## 2020-07-19 NOTE — Progress Notes (Signed)
See labs 

## 2020-07-22 ENCOUNTER — Other Ambulatory Visit: Payer: Self-pay | Admitting: Family Medicine

## 2020-07-22 DIAGNOSIS — I10 Essential (primary) hypertension: Secondary | ICD-10-CM

## 2020-07-25 ENCOUNTER — Telehealth: Payer: Self-pay | Admitting: Family Medicine

## 2020-07-25 NOTE — Telephone Encounter (Signed)
Referral Followup °

## 2020-09-03 LAB — HM MAMMOGRAPHY

## 2020-10-29 ENCOUNTER — Other Ambulatory Visit: Payer: Self-pay | Admitting: Family Medicine

## 2020-10-29 DIAGNOSIS — E785 Hyperlipidemia, unspecified: Secondary | ICD-10-CM

## 2020-10-29 NOTE — Telephone Encounter (Signed)
Requested Prescriptions  Pending Prescriptions Disp Refills  . simvastatin (ZOCOR) 10 MG tablet [Pharmacy Med Name: simvastatin 10 mg tablet] 90 tablet 1    Sig: TAKE 1 TABLET BY MOUTH NIGHTLY AT BEDTIME     Cardiovascular:  Antilipid - Statins Failed - 10/29/2020  1:32 PM      Failed - Total Cholesterol in normal range and within 360 days    Cholesterol, Total  Date Value Ref Range Status  07/12/2020 273 (H) 100 - 199 mg/dL Final         Failed - LDL in normal range and within 360 days    LDL Chol Calc (NIH)  Date Value Ref Range Status  07/12/2020 166 (H) 0 - 99 mg/dL Final         Failed - Triglycerides in normal range and within 360 days    Triglycerides  Date Value Ref Range Status  07/12/2020 284 (H) 0 - 149 mg/dL Final         Passed - HDL in normal range and within 360 days    HDL  Date Value Ref Range Status  07/12/2020 54 >39 mg/dL Final         Passed - Patient is not pregnant      Passed - Valid encounter within last 12 months    Recent Outpatient Visits          3 months ago Hyperlipidemia, unspecified hyperlipidemia type   [facility], Ines Bloomer, MD   3 months ago Hypertension, unspecified type   [facility] at [facility], Ranell Patrick, MD   1 year ago Screening for thyroid disorder   [facility], Ranell Patrick, MD   1 year ago Hyperlipidemia, unspecified hyperlipidemia type   [facility], Ranell Patrick, MD   1 year ago Contact dermatitis due to poison ivy   [facility] at [facility], Ranell Patrick, MD      Future Appointments            In 2 months Carlota Raspberry Ranell Patrick, MD [facility], [facility]

## 2020-12-15 ENCOUNTER — Other Ambulatory Visit: Payer: Medicare Other

## 2020-12-15 ENCOUNTER — Telehealth (INDEPENDENT_AMBULATORY_CARE_PROVIDER_SITE_OTHER): Payer: Medicare Other | Admitting: Family

## 2020-12-15 DIAGNOSIS — U071 COVID-19: Secondary | ICD-10-CM

## 2020-12-15 DIAGNOSIS — R059 Cough, unspecified: Secondary | ICD-10-CM

## 2020-12-15 MED ORDER — BENZONATATE 200 MG PO CAPS: 200.0000 mg | ORAL_CAPSULE | Freq: Two times a day (BID) | ORAL | 0 refills | Status: DC | PRN

## 2020-12-15 MED ORDER — PREDNISONE 10 MG (21) PO TBPK: ORAL_TABLET | ORAL | 0 refills | Status: DC

## 2020-12-15 NOTE — Progress Notes (Signed)
   Virtual Visit via Video   I connected with patient on 12/15/20 at  2:40 PM EDT by a video enabled telemedicine application and verified that I am speaking with the correct person using two identifiers.  Location patient: Home Location provider: [place], Office Persons participating in the virtual visit: Dutch Quint    Subjective:   HPI:   75 year old female is in today via video visit with c/o nasal/chest congestion, cough x 2 days. Tested positive for COVID this morning. Husband sick with similar symptoms. No SOB. Sats 97% at home  ROS:   See pertinent positives and negatives per HPI.  Patient Active Problem List   Diagnosis Date Noted  . Osteopenia 07/31/2017  . Atypical chest pain 07/18/2015  . Essential tremor 10/18/2014  . Vasomotor rhinitis   . HTN (hypertension)   . Hyperlipidemia   . Obesity   . Myofascial muscle pain   . Personal history of colonic polyps     Social History   Tobacco Use  . Smoking status: Never Smoker  . Smokeless tobacco: Never Used  Substance Use Topics  . Alcohol use: Yes    Alcohol/week: 4.0 standard drinks    Types: 4 Standard drinks or equivalent per week    Comment: wine    Current Outpatient Medications:  .  benazepril (LOTENSIN) 10 MG tablet, TAKE 1 TABLET(10 MG) BY MOUTH DAILY, Disp: 90 tablet, Rfl: 0 .  benzonatate (TESSALON) 200 MG capsule, Take 1 capsule (200 mg total) by mouth 2 (two) times daily as needed for cough., Disp: 20 capsule, Rfl: 0 .  carbamazepine (TEGRETOL XR) 100 MG 12 hr tablet, 2 po in morning, 3 po in evening, Disp: , Rfl:  .  predniSONE (STERAPRED UNI-PAK 21 TAB) 10 MG (21) TBPK tablet, As directed, Disp: 21 tablet, Rfl: 0 .  simvastatin (ZOCOR) 10 MG tablet, TAKE 1 TABLET BY MOUTH NIGHTLY AT BEDTIME, Disp: 90 tablet, Rfl: 1  No Known Allergies  Objective:   There were no vitals taken for this visit.  Patient is well-developed, well-nourished in no acute distress.  Resting  comfortably at home.  Head is normocephalic, atraumatic.  No labored breathing.  Speech is clear and coherent with logical content.  Patient is alert and oriented at baseline.    Assessment and Plan:  Noura was seen today for covid positive.  Diagnoses and all orders for this visit:  COVID-19  Cough  Other orders -     predniSONE (STERAPRED UNI-PAK 21 TAB) 10 MG (21) TBPK tablet; As directed -     benzonatate (TESSALON) 200 MG capsule; Take 1 capsule (200 mg total) by mouth 2 (two) times daily as needed for cough.  Call the office with any questions or concerns. Recheck as needed. Call if symptoms worsen or persist.   Kennyth Arnold, FNP 12/15/2020

## 2020-12-29 ENCOUNTER — Telehealth: Payer: Self-pay | Admitting: Family Medicine

## 2020-12-29 NOTE — Telephone Encounter (Signed)
Patient called and is still having massive head congestion after having had covid 2 weeks ago.  Patient tested again today and is negative.  She would like to know if there is anything she can take for the head congestion.  Please Advise

## 2020-12-30 NOTE — Telephone Encounter (Signed)
Please schedule appt- virtual ok

## 2021-01-04 ENCOUNTER — Other Ambulatory Visit: Payer: Self-pay

## 2021-01-04 ENCOUNTER — Encounter: Payer: Self-pay | Admitting: Family Medicine

## 2021-01-04 ENCOUNTER — Ambulatory Visit (INDEPENDENT_AMBULATORY_CARE_PROVIDER_SITE_OTHER): Payer: Medicare Other | Admitting: Family Medicine

## 2021-01-04 VITALS — BP 132/68 | HR 68 | Temp 98.2°F | Resp 17 | Ht 64.0 in | Wt 206.4 lb

## 2021-01-04 DIAGNOSIS — J309 Allergic rhinitis, unspecified: Secondary | ICD-10-CM

## 2021-01-04 DIAGNOSIS — I10 Essential (primary) hypertension: Secondary | ICD-10-CM

## 2021-01-04 DIAGNOSIS — G8929 Other chronic pain: Secondary | ICD-10-CM

## 2021-01-04 DIAGNOSIS — R519 Headache, unspecified: Secondary | ICD-10-CM

## 2021-01-04 DIAGNOSIS — E785 Hyperlipidemia, unspecified: Secondary | ICD-10-CM

## 2021-01-04 DIAGNOSIS — J0100 Acute maxillary sinusitis, unspecified: Secondary | ICD-10-CM

## 2021-01-04 LAB — LIPID PANEL
Cholesterol: 235 mg/dL — ABNORMAL HIGH (ref 0–200)
HDL: 56.8 mg/dL (ref >39.00)
NonHDL: 178.19
Total CHOL/HDL Ratio: 4
Triglycerides: 260 mg/dL — ABNORMAL HIGH (ref 0.0–149.0)
VLDL: 52 mg/dL — ABNORMAL HIGH (ref 0.0–40.0)

## 2021-01-04 LAB — COMPREHENSIVE METABOLIC PANEL
ALT: 24 U/L (ref 0–35)
AST: 20 U/L (ref 0–37)
Albumin: 4.1 g/dL (ref 3.5–5.2)
Alkaline Phosphatase: 54 U/L (ref 39–117)
BUN: 14 mg/dL (ref 6–23)
CO2: 26 mEq/L (ref 19–32)
Calcium: 9.9 mg/dL (ref 8.4–10.5)
Chloride: 103 mEq/L (ref 96–112)
Creatinine, Ser: 0.61 mg/dL (ref 0.40–1.20)
GFR: 88.5 mL/min (ref >60.00)
Glucose, Bld: 98 mg/dL (ref 70–99)
Potassium: 4.3 mEq/L (ref 3.5–5.1)
Sodium: 136 mEq/L (ref 135–145)
Total Bilirubin: 0.4 mg/dL (ref 0.2–1.2)
Total Protein: 6.6 g/dL (ref 6.0–8.3)

## 2021-01-04 LAB — LDL CHOLESTEROL, DIRECT: Direct LDL: 156 mg/dL

## 2021-01-04 MED ORDER — AMOXICILLIN-POT CLAVULANATE 875-125 MG PO TABS: 1.0000 | ORAL_TABLET | Freq: Two times a day (BID) | ORAL | 0 refills | Status: DC

## 2021-01-04 MED ORDER — SIMVASTATIN 10 MG PO TABS: ORAL_TABLET | ORAL | 1 refills | Status: DC

## 2021-01-04 MED ORDER — BENAZEPRIL HCL 10 MG PO TABS
ORAL_TABLET | ORAL | 1 refills | Status: DC
Start: 2021-01-04 — End: 2021-07-25

## 2021-01-04 MED ORDER — FLUTICASONE PROPIONATE 50 MCG/ACT NA SUSP: 1.0000 | Freq: Every day | NASAL | 6 refills | Status: DC

## 2021-01-04 NOTE — Progress Notes (Signed)
Subjective:  Patient ID: Shelby Flores, female    DOB: 06/28/1946  Age: 75 y.o. MRN: [id number]  CC:  Chief Complaint  Patient presents with  . Hypertension    Pt here for follow up, doing well no physical symptoms.   . Nasal Congestion    Pt reports congestion sinus, ears and some discharge from the eyes for 3 weeks following positive COVID is now negative.   . Back Pain    Pt also wanted you to be away having procedure for lesion on her lower back, no orders needed but wanted you to be aware.     HPI Shelby Flores presents for   Hypertension: Benazepril 10 mg daily. Home readings: none. No new side effects  BP Readings from Last 3 Encounters:  01/04/21 132/68  07/06/20 125/85  04/17/19 125/85   Lab Results  Component Value Date   CREATININE 0.71 07/12/2020   Hyperlipidemia: Now on Simvastatin 10 mg daily and CoQ10. No myalgias. Fatigue in past off coQ10 - doing well taking CoQ10.  Last ate 4 hrs ago.  Lab Results  Component Value Date   CHOL 273 (H) 07/12/2020   HDL 54 07/12/2020   LDLCALC 166 (H) 07/12/2020   TRIG 284 (H) 07/12/2020   CHOLHDL 5.1 (H) 07/12/2020   Lab Results  Component Value Date   ALT 41 (H) 07/12/2020   AST 28 07/12/2020   ALKPHOS 74 07/12/2020   BILITOT 0.8 07/12/2020    Nasal congestion/sinus congestion Followed by neurosurgery, Dr. Salomon Fick at [facility].  Referred from Dr. Dimas Millin.  With left trigeminal neuralgia, chronic.  Imaging indicated meningioma -trigeminal schwannoma/meningioma -left cavernous sinus.  Plan for gamma knife stereotactic radiosurgery.  Consulted with radiation oncologist 2 days ago - Dr Oneita Kras. Plan in August or early September.   Recently had COVID-19 infection, tested + 12/15/2020, initial sx's 4/26.  virtual visit same day.  Nasal, chest congestion and cough for 2 days prior.  Treated with prednisone taper, Tessalon Perles.  Has had persistent nasal congestion since that time, sinus congestion, ache in ears.  Hoarse at times. Yellow green congestion. Some eye discharge- watery during the day, crusting at night. No discolored eye d/c during day.  No fever.  No dyspnea.  Tx: saline ns every other morning, neti pot, advil cold and sinus. Seasonal allergies - no current allergy treatments.    History Patient Active Problem List   Diagnosis Date Noted  . Osteopenia 07/31/2017  . Atypical chest pain 07/18/2015  . Essential tremor 10/18/2014  . Vasomotor rhinitis   . HTN (hypertension)   . Hyperlipidemia   . Obesity   . Myofascial muscle pain   . Personal history of colonic polyps    Past Medical History:  Diagnosis Date  . Allergy   . Atypical chest pain 07/18/2015  . HTN (hypertension)   . Hyperlipidemia   . Myofacial muscle pain    FACIAL  . Obesity   . Personal history of colonic polyps 2009   Repeat colonoscopy 2014  . Vasomotor rhinitis    Past Surgical History:  Procedure Laterality Date  . ABDOMINAL HYSTERECTOMY  1994   PARTIAL-fibroids  . CHOLECYSTECTOMY  2002  . GANGLION CYST EXCISION  2002   lt hand   No Known Allergies Prior to Admission medications   Medication Sig Start Date End Date Taking? Authorizing Provider  Acetaminophen (TYLENOL) 325 MG CAPS Take 2 capsules by mouth 3 (three) times daily. Or Ibuprofen   Yes  [provider]  benazepril (LOTENSIN) 10 MG tablet TAKE 1 TABLET(10 MG) BY MOUTH DAILY 07/22/20  Yes Wendie Agreste, MD  carbamazepine (TEGRETOL XR) 100 MG 12 hr tablet 2 po in morning, 3 po in evening 11/07/20  Yes [provider]  ibuprofen (ADVIL) 200 MG tablet Take 400 mg by mouth every 6 (six) hours as needed.   Yes [provider]  simvastatin (ZOCOR) 10 MG tablet TAKE 1 TABLET BY MOUTH NIGHTLY AT BEDTIME 10/29/20  Yes Wendie Agreste, MD   Social History   Socioeconomic History  . Marital status: Married    Spouse name: Not on file  . Number of children: Not on file  . Years of education: Not on file  . Highest  education level: Not on file  Occupational History  . Not on file  Tobacco Use  . Smoking status: Never Smoker  . Smokeless tobacco: Never Used  Vaping Use  . Vaping Use: Never used  Substance and Sexual Activity  . Alcohol use: Yes    Alcohol/week: 4.0 standard drinks    Types: 4 Standard drinks or equivalent per week    Comment: wine  . Drug use: No  . Sexual activity: Yes    Birth control/protection: Surgical  Other Topics Concern  . Not on file  Social History Narrative   Epworth Sleepiness Scale = 8 (as of 07/18/15)   Social Determinants of Health   Financial Resource Strain: Not on file  Food Insecurity: Not on file  Transportation Needs: Not on file  Physical Activity: Not on file  Stress: Not on file  Social Connections: Not on file  Intimate Partner Violence: Not on file    Review of Systems  HENT: Positive for sinus pressure and sinus pain.   Eyes: Positive for discharge.  Respiratory: Negative for chest tightness and shortness of breath.   Cardiovascular: Negative for chest pain, palpitations and leg swelling.  Gastrointestinal: Negative for abdominal pain and blood in stool.  Neurological: Negative for dizziness, syncope, light-headedness and headaches.    Per HPI.   Objective:   Vitals:   01/04/21 1114  BP: 132/68  Pulse: 68  Resp: 17  Temp: 98.2 F (36.8 C)  TempSrc: Temporal  SpO2: 97%  Weight: 206 lb 6.4 oz (93.6 kg)  Height: 5\' 4"  (1.626 m)   Physical Exam Vitals reviewed.  Constitutional:      Appearance: She is well-developed.  HENT:     Head: Normocephalic and atraumatic.     Right Ear: Tympanic membrane and ear canal normal.     Left Ear: Tympanic membrane and ear canal normal.     Nose:     Comments: Left maxillary sinus tender to palpation.  No appreciable discharge noted at canthi of eyes.  Eyes:     Conjunctiva/sclera: Conjunctivae normal.     Pupils: Pupils are equal, round, and reactive to light.  Neck:     Vascular:  No carotid bruit.  Cardiovascular:     Rate and Rhythm: Normal rate and regular rhythm.     Heart sounds: Normal heart sounds.  Pulmonary:     Effort: Pulmonary effort is normal.     Breath sounds: Normal breath sounds.  Abdominal:     Palpations: Abdomen is soft. There is no pulsatile mass.     Tenderness: There is no abdominal tenderness.  Skin:    General: Skin is warm and dry.  Neurological:     Mental Status: She is alert  and oriented to person, place, and time.  Psychiatric:        Mood and Affect: Mood normal.        Behavior: Behavior normal.       35 minutes spent during visit, greater than 50% counseling and assimilation of information, chart review, and discussion of plan.   Assessment & Plan:  Shelby Flores is a 75 y.o. female . Acute maxillary sinusitis, recurrence not specified - Plan: amoxicillin-clavulanate (AUGMENTIN) 875-125 MG tablet Allergic rhinitis, unspecified seasonality, unspecified trigger - Plan: fluticasone (FLONASE) 50 MCG/ACT nasal spray  -Possible component of allergic rhinitis and sinusitis status post COVID infection.  Overall other symptoms have improved.   -Trial of Flonase nasal spray with correct use discussed.  Start Augmentin with potential side effects discussed.  RTC precautions.  Continue saline nasal spray.  Hyperlipidemia, unspecified hyperlipidemia type - Plan: simvastatin (ZOCOR) 10 MG tablet, Comprehensive metabolic panel, Lipid panel  -Tolerating current regimen, continue same.  Check labs  Hypertension, unspecified type - Plan: benazepril (LOTENSIN) 10 MG tablet  -Stable, continue benazepril, check labs  Anticipate improvement in trigeminal symptoms with upcoming gamma knife procedure.  Plans to continue carbamazepine for now  Meds ordered this encounter  Medications  . amoxicillin-clavulanate (AUGMENTIN) 875-125 MG tablet    Sig: Take 1 tablet by mouth 2 (two) times daily.    Dispense:  20 tablet    Refill:  0  .  fluticasone (FLONASE) 50 MCG/ACT nasal spray    Sig: Place 1-2 sprays into both nostrils daily.    Dispense:  16 g    Refill:  6  . simvastatin (ZOCOR) 10 MG tablet    Sig: TAKE 1 TABLET BY MOUTH NIGHTLY AT BEDTIME    Dispense:  90 tablet    Refill:  1    This prescription was filled on 10/29/2020. Any refills authorized will be placed on file.  . benazepril (LOTENSIN) 10 MG tablet    Sig: TAKE 1 TABLET(10 MG) BY MOUTH DAILY    Dispense:  90 tablet    Refill:  1   Patient Instructions  Try Flonase nasal spray 1 to 2 sprays/day for possible allergy cause of current symptoms, but I also sent a prescription for antibiotic for possible sinus infection.  Let me know if symptoms or not improving.  Okay to continue saline nasal spray.  No change in blood pressure cholesterol medications for now.  I will let you know if any concerns on blood work.  Good luck with the gamma knife procedure.  Keep me posted.      Signed, Merri Ray, MD [facility]

## 2021-01-04 NOTE — Patient Instructions (Signed)
Try Flonase nasal spray 1 to 2 sprays/day for possible allergy cause of current symptoms, but I also sent a prescription for antibiotic for possible sinus infection.  Let me know if symptoms or not improving.  Okay to continue saline nasal spray.  No change in blood pressure cholesterol medications for now.  I will let you know if any concerns on blood work.  Good luck with the gamma knife procedure.  Keep me posted.

## 2021-04-10 ENCOUNTER — Other Ambulatory Visit: Payer: Self-pay | Admitting: Family Medicine

## 2021-04-10 DIAGNOSIS — E785 Hyperlipidemia, unspecified: Secondary | ICD-10-CM

## 2021-04-10 DIAGNOSIS — I10 Essential (primary) hypertension: Secondary | ICD-10-CM

## 2021-04-23 ENCOUNTER — Telehealth: Payer: Self-pay

## 2021-04-23 NOTE — Telephone Encounter (Signed)
Called patient and left message for her to call her PCP's office and schedule her Initial Medicare Wellness Visit.

## 2021-07-24 ENCOUNTER — Other Ambulatory Visit: Payer: Self-pay | Admitting: Family Medicine

## 2021-07-24 DIAGNOSIS — I10 Essential (primary) hypertension: Secondary | ICD-10-CM

## 2021-07-24 DIAGNOSIS — E785 Hyperlipidemia, unspecified: Secondary | ICD-10-CM

## 2021-09-06 LAB — HM MAMMOGRAPHY

## 2021-09-07 ENCOUNTER — Encounter: Payer: Self-pay | Admitting: Family Medicine

## 2021-11-13 ENCOUNTER — Telehealth (INDEPENDENT_AMBULATORY_CARE_PROVIDER_SITE_OTHER): Payer: Medicare Other | Admitting: Family Medicine

## 2021-11-13 DIAGNOSIS — U071 COVID-19: Secondary | ICD-10-CM | POA: Diagnosis not present

## 2021-11-13 DIAGNOSIS — J019 Acute sinusitis, unspecified: Secondary | ICD-10-CM | POA: Diagnosis not present

## 2021-11-13 DIAGNOSIS — H9202 Otalgia, left ear: Secondary | ICD-10-CM

## 2021-11-13 MED ORDER — NIRMATRELVIR/RITONAVIR (PAXLOVID)TABLET: 3.0000 | ORAL_TABLET | Freq: Two times a day (BID) | ORAL | 0 refills | Status: AC

## 2021-11-13 MED ORDER — AMOXICILLIN-POT CLAVULANATE 875-125 MG PO TABS: 1.0000 | ORAL_TABLET | Freq: Two times a day (BID) | ORAL | 0 refills | Status: DC

## 2021-11-13 NOTE — Patient Instructions (Addendum)
Ok to use saline spray, netipot if needed. Mucinex may help congestion.  ?Augmentin for possible early sinus and ear infection.  ?Paxlovid ordered for covid antiviral. Stop simvastatin for 1 week. Get better soon! ?Return to the clinic or go to the nearest emergency room if any of your symptoms worsen or new symptoms occur. ? ?Everyone who has presumed or confirmed COVID-19 should stay home and isolate from other people for at least 5 full days (day 0 is the first day of symptoms or the date of the day of the positive viral test for asymptomatic persons). You can end isolation after 5 full days if you are fever-free for 24 hours without the use of fever-reducing medication and your other symptoms have improved (Loss of taste and smell may persist for weeks or months after recovery and need not delay the end of isolation). You should continue to wear a well-fitting mask around others at home and in public for 5 additional days (day 6 through day 10) after the end of your 5-day isolation period. If you are unable to wear a mask when around others, you should continue to isolate for a full 10 days. Avoid people who have weakened immune systems or are more likely to get very sick from COVID-19, and nursing homes and other high-risk settings, until after at least 10 days. ? ?If you continue to have fever or your other symptoms have not improved after 5 days of isolation, you should wait to end your isolation until you are fever-free for 24 hours without the use of fever-reducing medication and your other symptoms have improved. Continue to wear a well-fitting mask through day 10.  ? ?https://brown.org/.html ? ? ?

## 2021-11-13 NOTE — Progress Notes (Signed)
Virtual Visit via Video Note ? ?I connected with Shelby Flores on 11/13/21 at 4:26 PM by a video enabled telemedicine application and verified that I am speaking with the correct person using two identifiers.  ?Patient location: home - by self ?My location: office - [place].  ?  ?I discussed the limitations, risks, security and privacy concerns of performing an evaluation and management service by telephone and the availability of in person appointments. I also discussed with the patient that there may be a patient responsible charge related to this service. The patient expressed understanding and agreed to proceed, consent obtained ? ?Chief complaint: ? ?Chief Complaint  ?Patient presents with  ? Covid Positive  ?  Pt notes tested positive this morning, is prone to ear aches and sinus infections thought this was an infection notes no fever, denies Nausea and vomiting, pt notes sxs started Thursday afternoon,   ? ? ?History of Present Illness: ?Shelby Flores is a 76 y.o. female ? ?COVID-19 infection ?Initial symptoms 11/09/2021 - nasal congestion. Thought was typical allergies or sinus infection. Felt a little worse next day. Tx: zicam, emergen -C.  ?Felt better, then today worse ear pain- left ear pain, no change in hearing, no d/c.  ?Positive home test this morning. ?No cough. Same head congestion, but green mucus past 3-4 days. Sinus pain, pressure.  No relief with dayquil, nyquil. Netipot.  ? ?No chest pain, dyspnea at rest, no confusion, eating and drinking fluids without difficulty. ? ?VELFY-10 risk of complication score 4. ?COVID-19 immunizations in January, February 2021, booster November 2021, and second booster. Had covid in April 2022.  ?No longer on carbamazepine.  ?Still takes simvastatin (last dose last night),benazepril, baclofen.  ?GFR 88.5 in 12/2020 ?Patient Active Problem List  ? Diagnosis Date Noted  ? Osteopenia 07/31/2017  ? Atypical chest pain 07/18/2015  ? Essential tremor 10/18/2014   ? Vasomotor rhinitis   ? HTN (hypertension)   ? Hyperlipidemia   ? Obesity   ? Myofascial muscle pain   ? Personal history of colonic polyps   ? ?Past Medical History:  ?Diagnosis Date  ? Allergy   ? Atypical chest pain 07/18/2015  ? HTN (hypertension)   ? Hyperlipidemia   ? Myofacial muscle pain   ? FACIAL  ? Obesity   ? Personal history of colonic polyps 2009  ? Repeat colonoscopy 2014  ? Vasomotor rhinitis   ? ?Past Surgical History:  ?Procedure Laterality Date  ? ABDOMINAL HYSTERECTOMY  1994  ? PARTIAL-fibroids  ? CHOLECYSTECTOMY  2002  ? GANGLION CYST EXCISION  2002  ? lt hand  ? ?No Known Allergies ?Prior to Admission medications   ?Medication Sig Start Date End Date Taking? Authorizing Provider  ?Acetaminophen (TYLENOL) 325 MG CAPS Take 2 capsules by mouth 3 (three) times daily. Or Ibuprofen   Yes [provider]  ?baclofen (LIORESAL) 10 MG tablet Take 1 tablet by mouth 2 (two) times daily.   Yes [provider]  ?benazepril (LOTENSIN) 10 MG tablet TAKE 1 TABLET BY MOUTH EVERY DAY 07/25/21  Yes Wendie Agreste, MD  ?carbamazepine (TEGRETOL XR) 100 MG 12 hr tablet 2 po in morning, 3 po in evening 11/07/20  Yes [provider]  ?fluticasone (FLONASE) 50 MCG/ACT nasal spray Place 1-2 sprays into both nostrils daily. 01/04/21  Yes Wendie Agreste, MD  ?ibuprofen (ADVIL) 200 MG tablet Take 400 mg by mouth every 6 (six) hours as needed.   Yes [provider]  ?  simvastatin (ZOCOR) 10 MG tablet TAKE 1 TABLET BY MOUTH NIGHTLY AT BEDTIME 07/25/21  Yes Wendie Agreste, MD  ?amoxicillin-clavulanate (AUGMENTIN) 875-125 MG tablet Take 1 tablet by mouth 2 (two) times daily. ?Patient not taking: Reported on 11/13/2021 01/04/21   Wendie Agreste, MD  ? ?Social History  ? ?Socioeconomic History  ? Marital status: Married  ?  Spouse name: Not on file  ? Number of children: Not on file  ? Years of education: Not on file  ? Highest education level: Not on file  ?Occupational History  ? Not  on file  ?Tobacco Use  ? Smoking status: Never  ? Smokeless tobacco: Never  ?Vaping Use  ? Vaping Use: Never used  ?Substance and Sexual Activity  ? Alcohol use: Yes  ?  Alcohol/week: 4.0 standard drinks  ?  Types: 4 Standard drinks or equivalent per week  ?  Comment: wine  ? Drug use: No  ? Sexual activity: Yes  ?  Birth control/protection: Surgical  ?Other Topics Concern  ? Not on file  ?Social History Narrative  ? Epworth Sleepiness Scale = 8 (as of 07/18/15)  ? ?Social Determinants of Health  ? ?Financial Resource Strain: Not on file  ?Food Insecurity: Not on file  ?Transportation Needs: Not on file  ?Physical Activity: Not on file  ?Stress: Not on file  ?Social Connections: Not on file  ?Intimate Partner Violence: Not on file  ? ? ?Observations/Objective: ?There were no vitals filed for this visit. ?Nontoxic appearance on video.  Speaking in full sentences, no respiratory distress.  Coherent responses.  Euthymic mood.  All questions were answered with understanding plan expressed. ?Assessment and Plan: ?COVID-19 - Plan: nirmatrelvir/ritonavir EUA (PAXLOVID) 20 x 150 MG & 10 x '100MG'$  TABS ? ?Acute sinusitis, recurrence not specified, unspecified location - Plan: amoxicillin-clavulanate (AUGMENTIN) 875-125 MG tablet ? ?Left ear pain - Plan: amoxicillin-clavulanate (AUGMENTIN) 875-125 MG tablet ?COVID-19 infection with overall mild symptoms.  Pros and cons of use of antiviral discussed including timing.  She chose to start antiviral.  No longer takes Tegretol.  Interaction with simvastatin discussed, will hold for 1 week.  Most recent GFR was normal.  Full dose Paxlovid ordered with potential side effects discussed. ? ?In regards to her sinus symptoms and new left ear pain, possible early left otitis media, secondary sinus infection.  We will start Augmentin with potential side effects and risks of antibiotics discussed. ? ?Masking, isolation precautions per CDC, handout given.  ER precautions. ? ?Follow Up  Instructions: ? ?  ?I discussed the assessment and treatment plan with the patient. The patient was provided an opportunity to ask questions and all were answered. The patient agreed with the plan and demonstrated an understanding of the instructions. ?  ?The patient was advised to call back or seek an in-person evaluation if the symptoms worsen or if the condition fails to improve as anticipated. ? ? ?Wendie Agreste, MD ? ?

## 2022-01-16 ENCOUNTER — Other Ambulatory Visit: Payer: Self-pay | Admitting: Family Medicine

## 2022-01-16 DIAGNOSIS — E785 Hyperlipidemia, unspecified: Secondary | ICD-10-CM

## 2022-02-12 ENCOUNTER — Ambulatory Visit (INDEPENDENT_AMBULATORY_CARE_PROVIDER_SITE_OTHER): Payer: Medicare Other | Admitting: Family Medicine

## 2022-02-12 VITALS — BP 136/80 | Ht 63.0 in | Wt 196.0 lb

## 2022-02-12 DIAGNOSIS — G244 Idiopathic orofacial dystonia: Secondary | ICD-10-CM | POA: Insufficient documentation

## 2022-02-21 ENCOUNTER — Ambulatory Visit: Payer: Medicare Other | Admitting: Family Medicine

## 2022-02-23 ENCOUNTER — Encounter: Payer: Self-pay | Admitting: Family Medicine

## 2022-02-23 ENCOUNTER — Ambulatory Visit (INDEPENDENT_AMBULATORY_CARE_PROVIDER_SITE_OTHER): Payer: Self-pay | Admitting: Family Medicine

## 2022-02-23 DIAGNOSIS — G244 Idiopathic orofacial dystonia: Secondary | ICD-10-CM

## 2022-02-23 NOTE — Assessment & Plan Note (Signed)
Completed shockwave therapy  

## 2022-02-23 NOTE — Progress Notes (Signed)
  Shelby Flores - 76 y.o. female MRN [id number]  Date of birth: 12-03-1945  SUBJECTIVE:  Including CC & ROS.  No chief complaint on file.   Shelby Flores is a 76 y.o. female that is here for shockwave therapy.    Review of Systems See HPI   HISTORY: Past Medical, Surgical, Social, and Family History Reviewed & Updated per EMR.   Pertinent Historical Findings include:  Past Medical History:  Diagnosis Date   Allergy    Atypical chest pain 07/18/2015   HTN (hypertension)    Hyperlipidemia    Myofacial muscle pain    FACIAL   Obesity    Personal history of colonic polyps 2009   Repeat colonoscopy 2014   Vasomotor rhinitis     Past Surgical History:  Procedure Laterality Date   ABDOMINAL HYSTERECTOMY  1994   PARTIAL-fibroids   CHOLECYSTECTOMY  2002   GANGLION CYST EXCISION  2002   lt hand     PHYSICAL EXAM:  VS: Ht '5\' 3"'$  (1.6 m)   Wt 196 lb (88.9 kg)   BMI 34.72 kg/m  Physical Exam Gen: NAD, alert, cooperative with exam, well-appearing MSK:  Neurovascularly intact    ECSWT Note Shelby Flores 17-May-1946  Procedure: ECSWT Indications: left jaw pain  Procedure Details Consent: Risks of procedure as well as the alternatives and risks of each were explained to the (patient/caregiver).  Consent for procedure obtained. Time Out: Verified patient identification, verified procedure, site/side was marked, verified correct patient position, special equipment/implants available, medications/allergies/relevent history reviewed, required imaging and test results available.  Performed.  The area was cleaned with iodine and alcohol swabs.    The left jaw was was targeted for Extracorporeal shockwave therapy.   Preset: Myofascial trigger points Power Level: 40 Frequency: 8 Impulse/cycles: 2000 Head size: Small Session: 1  Patient did tolerate procedure well.    ASSESSMENT & PLAN:   Oromandibular dystonia Completed shockwave therapy

## 2022-03-01 ENCOUNTER — Encounter: Payer: Self-pay | Admitting: Family Medicine

## 2022-03-01 ENCOUNTER — Ambulatory Visit (INDEPENDENT_AMBULATORY_CARE_PROVIDER_SITE_OTHER): Payer: Medicare Other | Admitting: Family Medicine

## 2022-03-01 DIAGNOSIS — G244 Idiopathic orofacial dystonia: Secondary | ICD-10-CM

## 2022-03-01 NOTE — Assessment & Plan Note (Signed)
Completed shockwave therapy  

## 2022-03-01 NOTE — Progress Notes (Signed)
  Shelby Flores - 76 y.o. female MRN [id number]  Date of birth: July 19, 1946  SUBJECTIVE:  Including CC & ROS.  No chief complaint on file.   Shelby Flores is a 76 y.o. female that is here for shockwave therapy.    Review of Systems See HPI   HISTORY: Past Medical, Surgical, Social, and Family History Reviewed & Updated per EMR.   Pertinent Historical Findings include:  Past Medical History:  Diagnosis Date   Allergy    Atypical chest pain 07/18/2015   HTN (hypertension)    Hyperlipidemia    Myofacial muscle pain    FACIAL   Obesity    Personal history of colonic polyps 2009   Repeat colonoscopy 2014   Vasomotor rhinitis     Past Surgical History:  Procedure Laterality Date   ABDOMINAL HYSTERECTOMY  1994   PARTIAL-fibroids   CHOLECYSTECTOMY  2002   GANGLION CYST EXCISION  2002   lt hand     PHYSICAL EXAM:  VS: There were no vitals taken for this visit. Physical Exam Gen: NAD, alert, cooperative with exam, well-appearing MSK:  Neurovascularly intact    ECSWT Note [name] May 21, 1946  Procedure: ECSWT Indications: left jaw pain  Procedure Details Consent: Risks of procedure as well as the alternatives and risks of each were explained to the (patient/caregiver).  Consent for procedure obtained. Time Out: Verified patient identification, verified procedure, site/side was marked, verified correct patient position, special equipment/implants available, medications/allergies/relevent history reviewed, required imaging and test results available.  Performed.  The area was cleaned with iodine and alcohol swabs.    The left jaw  was targeted for Extracorporeal shockwave therapy.   Preset: Myofascial pain Power Level: 30 Frequency: 10 Impulse/cycles: 2000 Head size: Small Session: 2  Patient did tolerate procedure well.    ASSESSMENT & PLAN:   Oromandibular dystonia Completed shockwave therapy.

## 2022-03-08 ENCOUNTER — Ambulatory Visit (INDEPENDENT_AMBULATORY_CARE_PROVIDER_SITE_OTHER): Payer: Medicare Other | Admitting: Family Medicine

## 2022-03-08 DIAGNOSIS — G244 Idiopathic orofacial dystonia: Secondary | ICD-10-CM

## 2022-03-08 NOTE — Progress Notes (Signed)
  Shelby Flores - 76 y.o. female MRN [id number]  Date of birth: 06-12-1946  SUBJECTIVE:  Including CC & ROS.  No chief complaint on file.   Shelby Flores is a 76 y.o. female that is here for shockwave therapy.   Review of Systems See HPI   HISTORY: Past Medical, Surgical, Social, and Family History Reviewed & Updated per EMR.   Pertinent Historical Findings include:  Past Medical History:  Diagnosis Date   Allergy    Atypical chest pain 07/18/2015   HTN (hypertension)    Hyperlipidemia    Myofacial muscle pain    FACIAL   Obesity    Personal history of colonic polyps 2009   Repeat colonoscopy 2014   Vasomotor rhinitis     Past Surgical History:  Procedure Laterality Date   ABDOMINAL HYSTERECTOMY  1994   PARTIAL-fibroids   CHOLECYSTECTOMY  2002   GANGLION CYST EXCISION  2002   lt hand     PHYSICAL EXAM:  VS: There were no vitals taken for this visit. Physical Exam Gen: NAD, alert, cooperative with exam, well-appearing MSK:  Neurovascularly intact    ECSWT Note Shelby Flores [birth year]  Procedure: ECSWT Indications: left jaw pain   Procedure Details Consent: Risks of procedure as well as the alternatives and risks of each were explained to the (patient/caregiver).  Consent for procedure obtained. Time Out: Verified patient identification, verified procedure, site/side was marked, verified correct patient position, special equipment/implants available, medications/allergies/relevent history reviewed, required imaging and test results available.  Performed.  The area was cleaned with iodine and alcohol swabs.    The left jaw was targeted for Extracorporeal shockwave therapy.   Preset: Myofascial trigger points Power Level: 40 Frequency: 10 Impulse/cycles: 2000 Head size: medium  Session: 3  Patient did tolerate procedure well.    ASSESSMENT & PLAN:   Oromandibular dystonia Completed shockwave therapy

## 2022-03-08 NOTE — Assessment & Plan Note (Signed)
Completed shockwave therapy  

## 2022-03-12 ENCOUNTER — Telehealth: Payer: Self-pay | Admitting: Family Medicine

## 2022-03-12 NOTE — Telephone Encounter (Signed)
Left message for patient to call back and schedule Medicare Annual Wellness Visit (AWV).   Please offer to do virtually or by telephone.  Left office number and my jabber [phone].  AWVI eligible as of 04/21/2019  Please schedule at anytime with Nurse Health Advisor.

## 2022-03-22 ENCOUNTER — Ambulatory Visit: Payer: Medicare Other

## 2022-04-11 ENCOUNTER — Telehealth: Payer: Self-pay | Admitting: Family Medicine

## 2022-04-11 NOTE — Telephone Encounter (Signed)
Left message for patient to call back and schedule Medicare Annual Wellness Visit (AWV).   Please offer to do virtually or by telephone.  Left office number and my jabber [phone].  AWVI eligible as of 04/21/2019   Please schedule at anytime with Nurse Health Advisor.

## 2022-05-04 ENCOUNTER — Other Ambulatory Visit: Payer: Self-pay | Admitting: Family Medicine

## 2022-05-04 DIAGNOSIS — I10 Essential (primary) hypertension: Secondary | ICD-10-CM

## 2022-05-08 ENCOUNTER — Telehealth: Payer: Self-pay | Admitting: Family Medicine

## 2022-05-08 NOTE — Telephone Encounter (Signed)
Left message for patient to call back and schedule Medicare Annual Wellness Visit (AWV).   Please offer to do virtually or by telephone.  Left office number and my jabber [phone].  AWVI eligible as of 04/21/2019   Please schedule at anytime with Nurse Health Advisor.

## 2022-06-07 ENCOUNTER — Ambulatory Visit: Payer: Medicare Other

## 2022-06-21 ENCOUNTER — Ambulatory Visit (INDEPENDENT_AMBULATORY_CARE_PROVIDER_SITE_OTHER): Payer: Medicare Other

## 2022-06-21 VITALS — Ht 64.0 in | Wt 190.0 lb

## 2022-06-21 DIAGNOSIS — Z0000 Encounter for general adult medical examination without abnormal findings: Secondary | ICD-10-CM

## 2022-06-21 NOTE — Progress Notes (Signed)
Subjective:   Shelby Flores is a 76 y.o. female who presents for Medicare Annual (Subsequent) preventive examination.   I connected with  Shelby Flores on 06/21/22 by a audio enabled telemedicine application and verified that I am speaking with the correct person using two identifiers.  Patient Location: Home  Provider Location: Home Office  I discussed the limitations of evaluation and management by telemedicine. The patient expressed understanding and agreed to proceed.  Review of Systems     Cardiac Risk Factors include: advanced age (>63mn, >>47women)     Objective:    Today's Vitals   06/21/22 1400  Weight: 190 lb (86.2 kg)  Height: '5\' 4"'$  (1.626 m)   Body mass index is 32.61 kg/m.     06/21/2022    2:03 PM 01/05/2016    8:24 AM  Advanced Directives  Does Patient Have a Medical Advance Directive? Yes No  Type of AParamedicof [place] will   Copy of [place] Chart? No - copy requested   Would patient like information on creating a medical advance directive?  No - patient declined information    Current Medications (verified) Outpatient Encounter Medications as of 06/21/2022  Medication Sig   Acetaminophen (TYLENOL) 325 MG CAPS Take 2 capsules by mouth 3 (three) times daily. Or Ibuprofen   benazepril (LOTENSIN) 10 MG tablet TAKE 1 TABLET BY MOUTH EVERY DAY   simvastatin (ZOCOR) 10 MG tablet TAKE 1 TABLET BY MOUTH NIGHTLY AT BEDTIME   No facility-administered encounter medications on file as of 06/21/2022.    Allergies (verified) Patient has no known allergies.   History: Past Medical History:  Diagnosis Date   Allergy    Atypical chest pain 07/18/2015   HTN (hypertension)    Hyperlipidemia    Myofacial muscle pain    FACIAL   Obesity    Personal history of colonic polyps 2009   Repeat colonoscopy 2014   Vasomotor rhinitis    Past Surgical History:  Procedure Laterality Date   ABDOMINAL  HYSTERECTOMY  1994   PARTIAL-fibroids   CHOLECYSTECTOMY  2002   GANGLION CYST EXCISION  2002   lt hand   Family History  Problem Relation Age of Onset   Dystonia Mother        masseter   Hypertension Father    Atrial fibrillation Father        pacemaker   Heart failure Father    Colon polyps Father    Cancer Maternal Grandmother        colon   Lung cancer Sister    Stroke Paternal Grandfather    Cancer Maternal Grandfather    Colon cancer Maternal Grandfather    Colon polyps Brother    Social History   Socioeconomic History   Marital status: Married    Spouse name: Not on file   Number of children: Not on file   Years of education: Not on file   Highest education level: Not on file  Occupational History   Not on file  Tobacco Use   Smoking status: Never   Smokeless tobacco: Never  Vaping Use   Vaping Use: Never used  Substance and Sexual Activity   Alcohol use: Yes    Alcohol/week: 4.0 standard drinks of alcohol    Types: 4 Standard drinks or equivalent per week    Comment: wine   Drug use: No   Sexual activity: Yes    Birth control/protection: Surgical  Other Topics Concern   Not on file  Social History Narrative   Epworth Sleepiness Scale = 8 (as of 07/18/15)   Social Determinants of Health   Financial Resource Strain: Low Risk  (06/21/2022)   Overall Financial Resource Strain (CARDIA)    Difficulty of Paying Living Expenses: Not hard at all  Food Insecurity: No Food Insecurity (06/21/2022)   Hunger Vital Sign    Worried About Running Out of Food in the Last Year: Never true    Ran Out of Food in the Last Year: Never true  Transportation Needs: No Transportation Needs (06/21/2022)   PRAPARE - Hydrologist (Medical): No    Lack of Transportation (Non-Medical): No  Physical Activity: Sufficiently Active (06/21/2022)   Exercise Vital Sign    Days of Exercise per Week: 3 days    Minutes of Exercise per Session: 50 min  Stress:  No Stress Concern Present (06/21/2022)   [place]    Feeling of Stress : Not at all  Social Connections: [place] (06/21/2022)   Social Connection and Isolation Panel [NHANES]    Frequency of Communication with Friends and Family: More than three times a week    Frequency of Social Gatherings with Friends and Family: More than three times a week    Attends Religious Services: More than 4 times per year    Active Member of [employer] or Organizations: Yes    Attends Music therapist: More than 4 times per year    Marital Status: Married    Tobacco Counseling Counseling given: Not Answered   Clinical Intake:  Pre-visit preparation completed: Yes  Pain : No/denies pain     Nutritional Risks: None Diabetes: No  How often do you need to have someone help you when you read instructions, pamphlets, or other written materials from your doctor or pharmacy?: 1 - Never  Diabetic?no   Interpreter Needed?: No  Information entered by :: Jadene Pierini, LPN   Activities of Daily Living    06/21/2022    2:02 PM 06/03/2022    1:58 PM  In your present state of health, do you have any difficulty performing the following activities:  Hearing? 0 0   0  Vision? 0 0   0  Difficulty concentrating or making decisions? 0 0   0  Walking or climbing stairs? 0 0   0  Dressing or bathing? 0 0   0  Doing errands, shopping? 0 0   0  Preparing Food and eating ? N N   N  Using the Toilet? N N   N  In the past six months, have you accidently leaked urine? N N   N  Do you have problems with loss of bowel control? N N   N  Managing your Medications? N N   N  Managing your Finances? N N   N  Housekeeping or managing your Housekeeping? [name]    Patient Care Team: Wendie Agreste, MD as PCP - General (Family Medicine) Weber Cooks, MD as Referring Physician (Orthopedic Surgery) Skeet Latch, MD as Attending Physician (Cardiology)  Indicate any recent Medical Services you may have received from other than [facility] providers in the past year (date may be approximate).     Assessment:   This is a routine wellness examination for [name].  Hearing/Vision screen Vision Screening - Comments:: Annual ey exams wears  glasses   Dietary issues and exercise activities discussed: Current Exercise Habits: Home exercise routine, Type of exercise: walking, Time (Minutes): 45, Frequency (Times/Week): 3, Weekly Exercise (Minutes/Week): 135, Intensity: Mild, Exercise limited by: None identified   Goals Addressed             This Visit's Progress    Exercise 3x per week (30 min per time)         Depression Screen    11/13/2021    4:03 PM 07/06/2020    2:31 PM 04/17/2019    1:53 PM 02/25/2019    5:07 PM 01/23/2018    9:04 AM 02/26/2017    9:10 AM 09/18/2016    8:33 AM  PHQ 2/9 Scores  PHQ - 2 Score 0 0 0 0 0 0 0    Fall Risk    06/21/2022    2:00 PM 06/03/2022    1:58 PM 11/13/2021    4:03 PM 07/06/2020    2:30 PM 04/17/2019    1:53 PM  [place] in the past year? 0 0   0 0 0 0  Number falls in past yr: 0 0   0 0  0  Injury with Fall? 0 0   0 0  0  Risk for fall due to : No Fall Risks  No Fall Risks    Follow up Falls prevention discussed  Falls evaluation completed Falls evaluation completed Falls evaluation completed    [place]:  Any stairs in or around the home? No  If so, are there any without handrails? No  Home free of loose throw rugs in walkways, pet beds, electrical cords, etc? Yes  Adequate lighting in your home to reduce risk of falls? Yes   ASSISTIVE DEVICES UTILIZED TO PREVENT FALLS:  Life alert? No  Use of a cane, walker or w/c? No  Grab bars in the bathroom? Yes  Shower chair or bench in shower? No  Elevated toilet seat or a handicapped toilet? No        06/21/2022    2:02 PM  6CIT Screen  What Year?  0 points  What month? 0 points  What time? 0 points  Count back from 20 0 points  Months in reverse 0 points  Repeat phrase 0 points  Total Score 0 points    Immunizations Immunization History  Administered Date(s) Administered   Fluad Quad(high Dose 65+) 04/17/2019   Influenza, High Dose Seasonal PF 10/24/2018   Influenza, Seasonal, Injecte, Preservative Fre 09/16/2012   Influenza,inj,Quad PF,6+ Mos 06/22/2015, 09/18/2016   Influenza-Unspecified 06/21/2020   PFIZER(Purple Top)SARS-COV-2 Vaccination 09/10/2019, 10/01/2019, 06/21/2020   Pneumococcal Conjugate-13 10/18/2014   Pneumococcal Polysaccharide-23 09/18/2016   Tdap 01/18/2014    TDAP status: Up to date  Flu Vaccine status: Up to date  Pneumococcal vaccine status: Up to date  Covid-19 vaccine status: Completed vaccines  Qualifies for Shingles Vaccine? Yes   Zostavax completed No   Shingrix Completed?: No.    Education has been provided regarding the importance of this vaccine. Patient has been advised to call insurance company to determine out of pocket expense if they have not yet received this vaccine. Advised may also receive vaccine at local pharmacy or Health Dept. Verbalized acceptance and understanding.  Screening Tests Health Maintenance  Topic Date Due   Zoster Vaccines- Shingrix (1 of 2) Never done   COVID-19 Vaccine (4 - Pfizer series) 08/16/2020   INFLUENZA VACCINE  03/20/2022  Medicare Annual Wellness (AWV)  06/22/2023   TETANUS/TDAP  01/19/2024   COLONOSCOPY (Pts 45-106yr Insurance coverage will need to be confirmed)  01/22/2026   Pneumonia Vaccine 75 Years old  Completed   DEXA SCAN  Completed   Hepatitis C Screening  Completed   HPV VACCINES  Aged Out    Health Maintenance  Health Maintenance Due  Topic Date Due   Zoster Vaccines- Shingrix (1 of 2) Never done   COVID-19 Vaccine (4 - Pfizer series) 08/16/2020   INFLUENZA VACCINE  03/20/2022    Colorectal cancer screening: No longer  required.   Mammogram status: No longer required due to age.  Bone Density status: Completed 09/05/2019. Results reflect: Bone density results: OSTEOPENIA. Repeat every 5 years.  Lung Cancer Screening: (Low Dose CT Chest recommended if Age 826-80years, 30 pack-year currently smoking OR have quit w/in 15years.) does not qualify.   Lung Cancer Screening Referral: n/a  Additional Screening:  Hepatitis C Screening: does not qualify;   Vision Screening: Recommended annual ophthalmology exams for early detection of glaucoma and other disorders of the eye. Is the patient up to date with their annual eye exam?  Yes  Who is the provider or what is the name of the office in which the patient attends annual eye exams? Dr.Bowen  If pt is not established with a provider, would they like to be referred to a provider to establish care? No .   Dental Screening: Recommended annual dental exams for proper oral hygiene  Community Resource Referral / Chronic Care Management: CRR required this visit?  No   CCM required this visit?  No      Plan:     I have personally reviewed and noted the following in the patient's chart:   Medical and social history Use of alcohol, tobacco or illicit drugs  Current medications and supplements including opioid prescriptions. Patient is not currently taking opioid prescriptions. Functional ability and status Nutritional status Physical activity Advanced directives List of other physicians Hospitalizations, surgeries, and ER visits in previous 12 months Vitals Screenings to include cognitive, depression, and falls Referrals and appointments  In addition, I have reviewed and discussed with patient certain preventive protocols, quality metrics, and best practice recommendations. A written personalized care plan for preventive services as well as general preventive health recommendations were provided to patient.     LDaphane Shepherd LPN   [id number]  Nurse  Notes: none

## 2022-06-21 NOTE — Patient Instructions (Signed)
[name] , Thank you for taking time to come for your Medicare Wellness Visit. I appreciate your ongoing commitment to your health goals. Please review the following plan we discussed and let me know if I can assist you in the future.   These are the goals we discussed:  Goals      Exercise 3x per week (30 min per time)        This is a list of the screening recommended for you and due dates:  Health Maintenance  Topic Date Due   Zoster (Shingles) Vaccine (1 of 2) Never done   COVID-19 Vaccine (4 - Pfizer series) 08/16/2020   Flu Shot  03/20/2022   Medicare Annual Wellness Visit  06/22/2023   Tetanus Vaccine  01/19/2024   Colon Cancer Screening  01/22/2026   Pneumonia Vaccine  Completed   DEXA scan (bone density measurement)  Completed   Hepatitis C Screening: USPSTF Recommendation to screen - Ages 75-79 yo.  Completed   HPV Vaccine  Aged Out    Advanced directives: Please bring a copy of your health care power of attorney and living will to the office to be added to your chart at your convenience.   Conditions/risks identified: Aim for 30 minutes of exercise or brisk walking, 6-8 glasses of water, and 5 servings of fruits and vegetables each day.   Next appointment: Follow up in one year for your annual wellness visit    Preventive Care 65 Years and Older, Female Preventive care refers to lifestyle choices and visits with your health care provider that can promote health and wellness. What does preventive care include? A yearly physical exam. This is also called an annual well check. Dental exams once or twice a year. Routine eye exams. Ask your health care provider how often you should have your eyes checked. Personal lifestyle choices, including: Daily care of your teeth and gums. Regular physical activity. Eating a healthy diet. Avoiding tobacco and drug use. Limiting alcohol use. Practicing safe sex. Taking low-dose aspirin every day. Taking vitamin and mineral  supplements as recommended by your health care provider. What happens during an annual well check? The services and screenings done by your health care provider during your annual well check will depend on your age, overall health, lifestyle risk factors, and family history of disease. Counseling  Your health care provider may ask you questions about your: Alcohol use. Tobacco use. Drug use. Emotional well-being. Home and relationship well-being. Sexual activity. Eating habits. History of falls. Memory and ability to understand (cognition). Work and work Statistician. Reproductive health. Screening  You may have the following tests or measurements: Height, weight, and BMI. Blood pressure. Lipid and cholesterol levels. These may be checked every 5 years, or more frequently if you are over 75 years old. Skin check. Lung cancer screening. You may have this screening every year starting at age 38 if you have a 30-pack-year history of smoking and currently smoke or have quit within the past 15 years. Fecal occult blood test (FOBT) of the stool. You may have this test every year starting at age 82. Flexible sigmoidoscopy or colonoscopy. You may have a sigmoidoscopy every 5 years or a colonoscopy every 10 years starting at age 78. Hepatitis C blood test. Hepatitis B blood test. Sexually transmitted disease (STD) testing. Diabetes screening. This is done by checking your blood sugar (glucose) after you have not eaten for a while (fasting). You may have this done every 1-3 years. Bone density scan.  This is done to screen for osteoporosis. You may have this done starting at age 20. Mammogram. This may be done every 1-2 years. Talk to your health care provider about how often you should have regular mammograms. Talk with your health care provider about your test results, treatment options, and if necessary, the need for more tests. Vaccines  Your health care provider may recommend certain  vaccines, such as: Influenza vaccine. This is recommended every year. Tetanus, diphtheria, and acellular pertussis (Tdap, Td) vaccine. You may need a Td booster every 10 years. Zoster vaccine. You may need this after age 27. Pneumococcal 13-valent conjugate (PCV13) vaccine. One dose is recommended after age 50. Pneumococcal polysaccharide (PPSV23) vaccine. One dose is recommended after age 54. Talk to your health care provider about which screenings and vaccines you need and how often you need them. This information is not intended to replace advice given to you by your health care provider. Make sure you discuss any questions you have with your health care provider. Document Released: 09/02/2015 Document Revised: 04/25/2016 Document Reviewed: 06/07/2015 Elsevier Interactive Patient Education  2017 [place] Prevention in the Home Falls can cause injuries. They can happen to people of all ages. There are many things you can do to make your home safe and to help prevent falls. What can I do on the outside of my home? Regularly fix the edges of walkways and driveways and fix any cracks. Remove anything that might make you trip as you walk through a door, such as a raised step or threshold. Trim any bushes or trees on the path to your home. Use bright outdoor lighting. Clear any walking paths of anything that might make someone trip, such as rocks or tools. Regularly check to see if handrails are loose or broken. Make sure that both sides of any steps have handrails. Any raised decks and porches should have guardrails on the edges. Have any leaves, snow, or ice cleared regularly. Use sand or salt on walking paths during winter. Clean up any spills in your garage right away. This includes oil or grease spills. What can I do in the bathroom? Use night lights. Install grab bars by the toilet and in the tub and shower. Do not use towel bars as grab bars. Use non-skid mats or decals in  the tub or shower. If you need to sit down in the shower, use a plastic, non-slip stool. Keep the floor dry. Clean up any water that spills on the floor as soon as it happens. Remove soap buildup in the tub or shower regularly. Attach bath mats securely with double-sided non-slip rug tape. Do not have throw rugs and other things on the floor that can make you trip. What can I do in the bedroom? Use night lights. Make sure that you have a light by your bed that is easy to reach. Do not use any sheets or blankets that are too big for your bed. They should not hang down onto the floor. Have a firm chair that has side arms. You can use this for support while you get dressed. Do not have throw rugs and other things on the floor that can make you trip. What can I do in the kitchen? Clean up any spills right away. Avoid walking on wet floors. Keep items that you use a lot in easy-to-reach places. If you need to reach something above you, use a strong step stool that has a grab bar. Keep electrical cords  out of the way. Do not use floor polish or wax that makes floors slippery. If you must use wax, use non-skid floor wax. Do not have throw rugs and other things on the floor that can make you trip. What can I do with my stairs? Do not leave any items on the stairs. Make sure that there are handrails on both sides of the stairs and use them. Fix handrails that are broken or loose. Make sure that handrails are as long as the stairways. Check any carpeting to make sure that it is firmly attached to the stairs. Fix any carpet that is loose or worn. Avoid having throw rugs at the top or bottom of the stairs. If you do have throw rugs, attach them to the floor with carpet tape. Make sure that you have a light switch at the top of the stairs and the bottom of the stairs. If you do not have them, ask someone to add them for you. What else can I do to help prevent falls? Wear shoes that: Do not have high  heels. Have rubber bottoms. Are comfortable and fit you well. Are closed at the toe. Do not wear sandals. If you use a stepladder: Make sure that it is fully opened. Do not climb a closed stepladder. Make sure that both sides of the stepladder are locked into place. Ask someone to hold it for you, if possible. Clearly mark and make sure that you can see: Any grab bars or handrails. First and last steps. Where the edge of each step is. Use tools that help you move around (mobility aids) if they are needed. These include: Canes. Walkers. Scooters. Crutches. Turn on the lights when you go into a dark area. Replace any light bulbs as soon as they burn out. Set up your furniture so you have a clear path. Avoid moving your furniture around. If any of your floors are uneven, fix them. If there are any pets around you, be aware of where they are. Review your medicines with your doctor. Some medicines can make you feel dizzy. This can increase your chance of falling. Ask your doctor what other things that you can do to help prevent falls. This information is not intended to replace advice given to you by your health care provider. Make sure you discuss any questions you have with your health care provider. Document Released: 06/02/2009 Document Revised: 01/12/2016 Document Reviewed: 09/10/2014 Elsevier Interactive Patient Education  2017 [employer].

## 2022-08-10 NOTE — Progress Notes (Signed)
Subjective:   Shelby Flores is a 76 y.o. female who presents for Medicare Annual (Subsequent) preventive examination.  I connected with  Shelby Flores on 06/21/2022 by a audio enabled telemedicine application and verified that I am speaking with the correct person using two identifiers.  Patient Location: Home  Provider Location: Home Office  I discussed the limitations of evaluation and management by telemedicine. The patient expressed understanding and agreed to proceed.   Review of Systems     Cardiac Risk Factors include: advanced age (>39mn, >>68women)     Objective:    Today's Vitals   06/21/22 1400  Weight: 190 lb (86.2 kg)  Height: '5\' 4"'$  (1.626 m)   Body mass index is 32.61 kg/m.     06/21/2022    2:03 PM 01/05/2016    8:24 AM  Advanced Directives  Does Patient Have a Medical Advance Directive? Yes No  Type of AParamedicof ANovingerLiving will   [place] of [place]? No - copy requested   Would patient like information on creating a medical advance directive?  No - patient declined information    Current Medications (verified) Outpatient Encounter Medications as of 06/21/2022  Medication Sig   Acetaminophen (TYLENOL) 325 MG CAPS Take 2 capsules by mouth 3 (three) times daily. Or Ibuprofen   benazepril (LOTENSIN) 10 MG tablet TAKE 1 TABLET BY MOUTH EVERY DAY   simvastatin (ZOCOR) 10 MG tablet TAKE 1 TABLET BY MOUTH NIGHTLY AT BEDTIME   No facility-administered encounter medications on file as of 06/21/2022.    Allergies (verified) Patient has no known allergies.   History: Past Medical History:  Diagnosis Date   Allergy    Atypical chest pain 07/18/2015   HTN (hypertension)    Hyperlipidemia    Myofacial muscle pain    FACIAL   Obesity    Personal history of colonic polyps 2009   Repeat colonoscopy 2014   Vasomotor rhinitis    Past Surgical History:  Procedure Laterality Date   ABDOMINAL  HYSTERECTOMY  1994   PARTIAL-fibroids   CHOLECYSTECTOMY  2002   GANGLION CYST EXCISION  2002   lt hand   Family History  Problem Relation Age of Onset   Dystonia Mother        masseter   Hypertension Father    Atrial fibrillation Father        pacemaker   Heart failure Father    Colon polyps Father    Cancer Maternal Grandmother        colon   Lung cancer Sister    Stroke Paternal Grandfather    Cancer Maternal Grandfather    Colon cancer Maternal Grandfather    Colon polyps Brother    Social History   Socioeconomic History   Marital status: Married    Spouse name: Not on file   Number of children: Not on file   Years of education: Not on file   Highest education level: Not on file  Occupational History   Not on file  Tobacco Use   Smoking status: Never   Smokeless tobacco: Never  Vaping Use   Vaping Use: Never used  Substance and Sexual Activity   Alcohol use: Yes    Alcohol/week: 4.0 standard drinks of alcohol    Types: 4 Standard drinks or equivalent per week    Comment: wine   Drug use: No   Sexual activity: Yes    Birth control/protection: Surgical  Other Topics Concern   Not on file  Social History Narrative   Epworth Sleepiness Scale = 8 (as of 07/18/15)   Social Determinants of Health   Financial Resource Strain: Low Risk  (06/21/2022)   Overall Financial Resource Strain (CARDIA)    Difficulty of Paying Living Expenses: Not hard at all  Food Insecurity: No Food Insecurity (06/21/2022)   Hunger Vital Sign    Worried About Running Out of Food in the Last Year: Never true    Ran Out of Food in the Last Year: Never true  Transportation Needs: No Transportation Needs (06/21/2022)   PRAPARE - Hydrologist (Medical): No    Lack of Transportation (Non-Medical): No  Physical Activity: Sufficiently Active (06/21/2022)   Exercise Vital Sign    Days of Exercise per Week: 3 days    Minutes of Exercise per Session: 50 min  Stress:  No Stress Concern Present (06/21/2022)   [place]    Feeling of Stress : Not at all  Social Connections: [place] (06/21/2022)   Social Connection and Isolation Panel [NHANES]    Frequency of Communication with Friends and Family: More than three times a week    Frequency of Social Gatherings with Friends and Family: More than three times a week    Attends Religious Services: More than 4 times per year    Active Member of [employer] or Organizations: Yes    Attends Music therapist: More than 4 times per year    Marital Status: Married    Tobacco Counseling Counseling given: Not Answered   Clinical Intake:  Pre-visit preparation completed: Yes  Pain : No/denies pain     Nutritional Risks: None Diabetes: No  How often do you need to have someone help you when you read instructions, pamphlets, or other written materials from your doctor or pharmacy?: 1 - Never  Diabetic?no  Interpreter Needed?: No  Information entered by :: Jadene Pierini, LPN   Activities of Daily Living    06/21/2022    2:02 PM 06/03/2022    1:58 PM  In your present state of health, do you have any difficulty performing the following activities:  Hearing? 0 0  Vision? 0 0  Difficulty concentrating or making decisions? 0 0  Walking or climbing stairs? 0 0  Dressing or bathing? 0 0  Doing errands, shopping? 0 0  Preparing Food and eating ? N N  Using the Toilet? N N  In the past six months, have you accidently leaked urine? N N  Do you have problems with loss of bowel control? N N  Managing your Medications? N N  Managing your Finances? N N  Housekeeping or managing your Housekeeping? N N    Patient Care Team: Wendie Agreste, MD as PCP - General (Family Medicine) Weber Cooks, MD as Referring Physician (Orthopedic Surgery) Skeet Latch, MD as Attending Physician (Cardiology)  Indicate any recent  Medical Services you may have received from other than [facility] providers in the past year (date may be approximate).     Assessment:   This is a routine wellness examination for Shelby Flores and Shelby Flores.  Hearing/Vision screen Vision Screening - Comments:: Annual ey exams wears glasses   Dietary issues and exercise activities discussed: Current Exercise Habits: Home exercise routine, Type of exercise: walking, Time (Minutes): 45, Frequency (Times/Week): 3, Weekly Exercise (Minutes/Week): 135, Intensity: Mild, Exercise limited by: None identified   Goals Addressed  This Visit's Progress    Exercise 3x per week (30 min per time)         Depression Screen    06/25/2022    8:10 AM 11/13/2021    4:03 PM 07/06/2020    2:31 PM 04/17/2019    1:53 PM 02/25/2019    5:07 PM 01/23/2018    9:04 AM 02/26/2017    9:10 AM  PHQ 2/9 Scores  PHQ - 2 Score 0 0 0 0 0 0 0    Fall Risk    06/21/2022    2:00 PM 06/03/2022    1:58 PM 11/13/2021    4:03 PM 07/06/2020    2:30 PM 04/17/2019    1:53 PM  [place] in the past year? 0 0 0 0 0  Number falls in past yr: 0 0 0  0  Injury with Fall? 0 0 0  0  Risk for fall due to : No Fall Risks  No Fall Risks    Follow up Falls prevention discussed  Falls evaluation completed Falls evaluation completed Falls evaluation completed   Cognitive Function:        06/21/2022    2:02 PM  6CIT Screen  What Year? 0 points  What month? 0 points  What time? 0 points  Count back from 20 0 points  Months in reverse 0 points  Repeat phrase 0 points  Total Score 0 points    Immunizations Immunization History  Administered Date(s) Administered   Fluad Quad(high Dose 65+) 04/17/2019   Influenza, High Dose Seasonal PF 10/24/2018   Influenza, Seasonal, Injecte, Preservative Fre 09/16/2012   Influenza,inj,Quad PF,6+ Mos 06/22/2015, 09/18/2016   Influenza-Unspecified 06/21/2020   PFIZER(Purple Top)SARS-COV-2 Vaccination 09/10/2019, 10/01/2019, 06/21/2020    Pneumococcal Conjugate-13 10/18/2014   Pneumococcal Polysaccharide-23 09/18/2016   Tdap 01/18/2014    Screening Tests Health Maintenance  Topic Date Due   Zoster Vaccines- Shingrix (1 of 2) Never done   INFLUENZA VACCINE  03/20/2022   COVID-19 Vaccine (4 - 2023-24 season) 04/20/2022   Medicare Annual Wellness (AWV)  06/22/2023   DTaP/Tdap/Td (2 - Td or Tdap) 01/19/2024   COLONOSCOPY (Pts 45-37yr Insurance coverage will need to be confirmed)  01/22/2026   Pneumonia Vaccine 75 Years old  Completed   DEXA SCAN  Completed   Hepatitis C Screening  Completed   HPV VACCINES  Aged Out    Health Maintenance  Health Maintenance Due  Topic Date Due   Zoster Vaccines- Shingrix (1 of 2) Never done   INFLUENZA VACCINE  03/20/2022   COVID-19 Vaccine (4 - 2023-24 season) 04/20/2022     Dental Screening: Recommended annual dental exams for proper oral hygiene  Community Resource Referral / Chronic Care Management: CRR required this visit?  No   CCM required this visit?  No      Plan:     I have personally reviewed and noted the following in the patient's chart:   Medical and social history Use of alcohol, tobacco or illicit drugs  Current medications and supplements including opioid prescriptions. Patient is not currently taking opioid prescriptions. Functional ability and status Nutritional status Physical activity Advanced directives List of other physicians Hospitalizations, surgeries, and ER visits in previous 12 months Vitals Screenings to include cognitive, depression, and falls Referrals and appointments  In addition, I have reviewed and discussed with patient certain preventive protocols, quality metrics, and best practice recommendations. A written personalized care plan for preventive services as well as general  preventive health recommendations were provided to patient.     Randel Pigg, LPN - 45/03/997  Nurse Notes: none

## 2022-08-10 NOTE — Addendum Note (Signed)
Addended by: Adalberto Cole E on: 08/10/2022 02:48 PM   Modules accepted: Orders

## 2022-08-29 ENCOUNTER — Ambulatory Visit (INDEPENDENT_AMBULATORY_CARE_PROVIDER_SITE_OTHER): Payer: Medicare Other | Admitting: Family Medicine

## 2022-08-29 ENCOUNTER — Encounter: Payer: Self-pay | Admitting: Family Medicine

## 2022-08-29 VITALS — BP 110/64 | HR 85 | Temp 97.6°F | Ht 64.0 in | Wt 199.6 lb

## 2022-08-29 DIAGNOSIS — Z6834 Body mass index (BMI) 34.0-34.9, adult: Secondary | ICD-10-CM

## 2022-08-29 DIAGNOSIS — R519 Headache, unspecified: Secondary | ICD-10-CM | POA: Diagnosis not present

## 2022-08-29 DIAGNOSIS — E785 Hyperlipidemia, unspecified: Secondary | ICD-10-CM | POA: Diagnosis not present

## 2022-08-29 DIAGNOSIS — R739 Hyperglycemia, unspecified: Secondary | ICD-10-CM

## 2022-08-29 DIAGNOSIS — G8929 Other chronic pain: Secondary | ICD-10-CM

## 2022-08-29 DIAGNOSIS — I10 Essential (primary) hypertension: Secondary | ICD-10-CM | POA: Diagnosis not present

## 2022-08-29 MED ORDER — BENAZEPRIL HCL 10 MG PO TABS: 10.0000 mg | ORAL_TABLET | Freq: Every day | ORAL | 1 refills | Status: DC

## 2022-08-29 MED ORDER — SIMVASTATIN 10 MG PO TABS: ORAL_TABLET | ORAL | 1 refills | Status: DC

## 2022-08-29 NOTE — Progress Notes (Signed)
Subjective:  Patient ID: Shelby Flores, female    DOB: 04-11-1946  Age: 77 y.o. MRN: [id number]  CC:  Chief Complaint  Patient presents with   Weight Loss    Pt would like to discuss starting weight loss medications does not have a med in mind but knows she would like to lose weight     HPI Shelby Flores presents for   Hypertension: Benazepril '10mg'$  qd. No new side effects.  Home readings:none BP Readings from Last 3 Encounters:  08/29/22 110/64  02/12/22 136/80  01/04/21 132/68   Lab Results  Component Value Date   CREATININE 0.61 01/04/2021   Hyperlipidemia: Simvastatin '10mg'$  qd.  No new myalgias/side effects.  Lab Results  Component Value Date   CHOL 235 (H) 01/04/2021   HDL 56.80 01/04/2021   LDLCALC 166 (H) 07/12/2020   LDLDIRECT 156.0 01/04/2021   TRIG 260.0 (H) 01/04/2021   CHOLHDL 4 01/04/2021   Lab Results  Component Value Date   ALT 24 01/04/2021   AST 20 01/04/2021   ALKPHOS 54 01/04/2021   BILITOT 0.4 01/04/2021   Obesity: Has been overweight for some time,  Started exercise at a gym 3-4 days per week for past year. 87mn each. Yoga, water fit, balance and flexibility classes.  Half sweet tea, some diet coke at times, no other sodas.  Fast food, fried food - infrequent. Few times per week. Usually cooks at home.  Difficulty with weight loss with above.  Unsure if insurance has wt loss coverage.  Glucose 109 in 2021.  Not fasting today.  No FH of MEN syndrome or medullary thyroid CA. No hx of pancreaitis.   Wt Readings from Last 3 Encounters:  08/29/22 199 lb 9.6 oz (90.5 kg)  06/21/22 190 lb (86.2 kg)  02/23/22 196 lb (88.9 kg)   Body mass index is 34.26 kg/m.   Chronic jaw pain: Has seen neuro - Dr. MDimas Millin S/p gamma knife, lesion same size on subsequent MRI. Not planning on further gamma knife.  Had botox injections - min relief. Not planning to continue. Some difficulty swallowing afterward.  Now taking for alleve and tylenol - doing ok.  Could not tolerate mm. relaxers or CNS acting meds.  Has number of other neuro if needed with Dr. 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   Osteopenia 07/31/2017   Atypical chest pain 07/18/2015   Essential tremor 10/18/2014   Vasomotor rhinitis    HTN (hypertension)    Hyperlipidemia    Obesity    Myofascial muscle pain    Personal history of colonic polyps    Past Medical History:  Diagnosis Date   Allergy    Atypical chest pain 07/18/2015   HTN (hypertension)    Hyperlipidemia    Myofacial muscle pain    FACIAL   Obesity    Personal history of colonic polyps 2009   Repeat colonoscopy 2014   Vasomotor rhinitis    Past Surgical History:  Procedure Laterality Date   ABDOMINAL HYSTERECTOMY  1994   PARTIAL-fibroids   CHOLECYSTECTOMY  2002   GANGLION CYST EXCISION  2002   lt hand   No Known Allergies Prior to Admission medications   Medication Sig Start Date End Date Taking? Authorizing Provider  Acetaminophen (TYLENOL) 325 MG CAPS Take 2 capsules by mouth 3 (three) times daily. Or Ibuprofen   Yes [provider]  benazepril (LOTENSIN) 10 MG tablet  TAKE 1 TABLET BY MOUTH EVERY DAY 05/07/22  Yes Wendie Agreste, MD  Naproxen Sodium (ALEVE) 220 MG CAPS Take 1 capsule by mouth daily.   Yes [provider]  simvastatin (ZOCOR) 10 MG tablet TAKE 1 TABLET BY MOUTH NIGHTLY AT BEDTIME 01/17/22  Yes Wendie Agreste, MD   Social History   Socioeconomic History   Marital status: Married    Spouse name: Not on file   Number of children: Not on file   Years of education: Not on file   Highest education level: Not on file  Occupational History   Not on file  Tobacco Use   Smoking status: Never   Smokeless tobacco: Never  Vaping Use   Vaping Use: Never used  Substance and Sexual Activity   Alcohol use: Yes    Alcohol/week: 4.0 standard drinks of alcohol    Types: 4 Standard drinks or  equivalent per week    Comment: wine   Drug use: No   Sexual activity: Yes    Birth control/protection: Surgical  Other Topics Concern   Not on file  Social History Narrative   Epworth Sleepiness Scale = 8 (as of 07/18/15)   Social Determinants of Health   Financial Resource Strain: Low Risk  (06/21/2022)   Overall Financial Resource Strain (CARDIA)    Difficulty of Paying Living Expenses: Not hard at all  Food Insecurity: No Food Insecurity (06/21/2022)   Hunger Vital Sign    Worried About Running Out of Food in the Last Year: Never true    Ran Out of Food in the Last Year: Never true  Transportation Needs: No Transportation Needs (06/21/2022)   PRAPARE - Hydrologist (Medical): No    Lack of Transportation (Non-Medical): No  Physical Activity: Sufficiently Active (06/21/2022)   Exercise Vital Sign    Days of Exercise per Week: 3 days    Minutes of Exercise per Session: 50 min  Stress: No Stress Concern Present (06/21/2022)   [place]    Feeling of Stress : Not at all  Social Connections: [place] (06/21/2022)   Social Connection and Isolation Panel [NHANES]    Frequency of Communication with Friends and Family: More than three times a week    Frequency of Social Gatherings with Friends and Family: More than three times a week    Attends Religious Services: More than 4 times per year    Active Member of [employer] or Organizations: Yes    Attends Music therapist: More than 4 times per year    Marital Status: Married  Human resources officer Violence: Not At Risk (06/21/2022)   Humiliation, Afraid, Rape, and Kick questionnaire    Fear of Current or Ex-Partner: No    Emotionally Abused: No    Physically Abused: No    Sexually Abused: No    Review of Systems  Constitutional:  Negative for fatigue and unexpected weight change.  Respiratory:  Negative for chest tightness  and shortness of breath.   Cardiovascular:  Negative for chest pain, palpitations and leg swelling.  Gastrointestinal:  Negative for abdominal pain and blood in stool.  Neurological:  Negative for dizziness, syncope, light-headedness and headaches.     Objective:   Vitals:   08/29/22 1347  BP: 110/64  Pulse: 85  Temp: 97.6 F (36.4 C)  TempSrc: Oral  SpO2: 96%  Weight: 199 lb 9.6 oz (90.5 kg)  Height: '5\' 4"'$  (  1.626 m)     Physical Exam Vitals reviewed.  Constitutional:      Appearance: Normal appearance. She is well-developed.  HENT:     Head: Normocephalic and atraumatic.  Eyes:     Conjunctiva/sclera: Conjunctivae normal.     Pupils: Pupils are equal, round, and reactive to light.  Neck:     Vascular: No carotid bruit.  Cardiovascular:     Rate and Rhythm: Normal rate and regular rhythm.     Heart sounds: Normal heart sounds.  Pulmonary:     Effort: Pulmonary effort is normal.     Breath sounds: Normal breath sounds.  Abdominal:     Palpations: Abdomen is soft. There is no pulsatile mass.     Tenderness: There is no abdominal tenderness.  Musculoskeletal:     Right lower leg: No edema.     Left lower leg: No edema.  Skin:    General: Skin is warm and dry.  Neurological:     Mental Status: She is alert and oriented to person, place, and time.  Psychiatric:        Mood and Affect: Mood normal.        Behavior: Behavior normal.        Assessment & Plan:  Shelby Flores is a 77 y.o. female . Hyperlipidemia, unspecified hyperlipidemia type - Plan: Comprehensive metabolic panel, Lipid panel, simvastatin (ZOCOR) 10 MG tablet  -  Stable, tolerating current regimen. Medications refilled. Labs pending as above.   Hypertension, unspecified type - Plan: Comprehensive metabolic panel, benazepril (LOTENSIN) 10 MG tablet  -  Stable, tolerating current regimen. Medications refilled. Labs pending as above.   Hyperglycemia - Plan: Hemoglobin A1c, BMI of 34  - check  A1c, follow up to discuss results in the next 6 weeks, continue exercise and watching diet for weight loss.  She will check with her insurance to see if medication for weight loss may be covered and phone number provided for healthy weight and wellness.  Chronic face pain  -Update on symptoms as above.  Option to follow-up with new neurologist if needed.  No new treatments at this time.  Meds ordered this encounter  Medications   benazepril (LOTENSIN) 10 MG tablet    Sig: Take 1 tablet (10 mg total) by mouth daily.    Dispense:  90 tablet    Refill:  1   simvastatin (ZOCOR) 10 MG tablet    Sig: TAKE 1 TABLET BY MOUTH NIGHTLY AT BEDTIME    Dispense:  90 tablet    Refill:  1    This prescription was filled on 01/16/2022. Any refills authorized will be placed on file.   Patient Instructions  Thanks for coming in today.  I will check some of the monitoring labs for your medicines but no changes for now.  Check with your insurance to see if they do have a weight loss coverage and if so we could consider the medications for weight loss as we briefly discussed today.  Here is another option for weight loss specialist locally.    [facility]  [phone]  Keep up the good work with watching diet and exercise.  Follow-up in 6 weeks, but let me know if there are questions sooner.  Take care!    Signed,   Merri Ray, MD [place] 08/29/22 2:19 PM

## 2022-08-29 NOTE — Patient Instructions (Addendum)
Thanks for coming in today.  I will check some of the monitoring labs for your medicines but no changes for now.  Check with your insurance to see if they do have a weight loss coverage and if so we could consider the medications for weight loss as we briefly discussed today.  Here is another option for weight loss specialist locally.    Fasting labs:  [facility] in 8:30-4:30 during weekdays, no appointment needed [place].  [place], [place] [id number]   [facility]  [phone]  Keep up the good work with watching diet and exercise.  Follow-up in 6 weeks, but let me know if there are questions sooner.  Take care!

## 2022-08-29 NOTE — Addendum Note (Signed)
Addended by: Merri Ray R on: 08/29/2022 02:25 PM   Modules accepted: Orders

## 2022-09-17 ENCOUNTER — Telehealth: Payer: Self-pay

## 2022-09-17 NOTE — Telephone Encounter (Signed)
Paperwork completed and placed in fax bin at back nurse station  

## 2022-09-17 NOTE — Telephone Encounter (Signed)
Placed mammography order in your to be signed folder at nurses station

## 2022-09-18 LAB — HM MAMMOGRAPHY

## 2022-09-18 NOTE — Telephone Encounter (Signed)
Faxed back.

## 2022-10-17 ENCOUNTER — Ambulatory Visit: Payer: Medicare Other | Admitting: Family Medicine

## 2022-12-03 ENCOUNTER — Encounter: Payer: Self-pay | Admitting: *Deleted

## 2022-12-21 ENCOUNTER — Telehealth (INDEPENDENT_AMBULATORY_CARE_PROVIDER_SITE_OTHER): Payer: Medicare Other | Admitting: Family Medicine

## 2022-12-21 DIAGNOSIS — U071 COVID-19: Secondary | ICD-10-CM

## 2022-12-21 MED ORDER — NIRMATRELVIR/RITONAVIR (PAXLOVID)TABLET
3.0000 | ORAL_TABLET | Freq: Two times a day (BID) | ORAL | 0 refills | Status: AC
Start: 2022-12-21 — End: 2022-12-26

## 2022-12-21 NOTE — Progress Notes (Signed)
Virtual Visit via Telephone Note  I connected with Shelby Flores on 12/23/22 at 2:00pm by telephone verified that I am speaking with the correct person using two identifiers. Patient was able to connect  to the telehealth video services. She agreed to the phone visit instead. Call ended on 2:13pm. The call lasted 13 minutes.   Patient Location: Home Provider Location: office - [facility].    I discussed the limitations, risks, security and privacy concerns of performing an evaluation and management service by telephone and the availability of in person appointments. I also discussed with the patient that there may be a patient responsible charge related to this service. The patient expressed understanding and agreed to proceed, consent obtained  Chief Complaint  Patient presents with   Covid Positive    History of Present Illness: Shelby Flores is a 77 y.o. female presents with a home positive covid test.  Recently returned from [place] yesterday evening.   +body aches and pains +flushed +productive cough-sputum is yellow and thick +tightness in chest +bilateral ear pain, but no eye drainage +sore throat +nasal congestion and drainage  -fever -chest pain -shortness of breath -headaches, dizziness, or lightheadedness  She reports last night she took Nyquil with relief.   Patient Active Problem List   Diagnosis Date Noted   Oromandibular dystonia 02/12/2022   Osteopenia 07/31/2017   Atypical chest pain 07/18/2015   Essential tremor 10/18/2014   Vasomotor rhinitis    HTN (hypertension)    Hyperlipidemia    Obesity    Myofascial muscle pain    Personal history of colonic polyps    Past Medical History:  Diagnosis Date   Allergy    Atypical chest pain 07/18/2015   HTN (hypertension)    Hyperlipidemia    Myofacial muscle pain    FACIAL   Obesity    Personal history of colonic polyps 2009   Repeat colonoscopy 2014   Vasomotor rhinitis    Past Surgical  History:  Procedure Laterality Date   ABDOMINAL HYSTERECTOMY  1994   PARTIAL-fibroids   CHOLECYSTECTOMY  2002   GANGLION CYST EXCISION  2002   lt hand   No Known Allergies Prior to Admission medications   Medication Sig Start Date End Date Taking? Authorizing Provider  Acetaminophen (TYLENOL) 325 MG CAPS Take 2 capsules by mouth 3 (three) times daily. Or Ibuprofen   Yes [provider]  benazepril (LOTENSIN) 10 MG tablet Take 1 tablet (10 mg total) by mouth daily. 08/29/22  Yes Shade Flood, MD  Naproxen Sodium (ALEVE) 220 MG CAPS Take 1 capsule by mouth daily.   Yes [provider]  simvastatin (ZOCOR) 10 MG tablet TAKE 1 TABLET BY MOUTH NIGHTLY AT BEDTIME 08/29/22  Yes Shade Flood, MD   Social History   Socioeconomic History   Marital status: Married    Spouse name: Not on file   Number of children: Not on file   Years of education: Not on file   Highest education level: Not on file  Occupational History   Not on file  Tobacco Use   Smoking status: Never   Smokeless tobacco: Never  Vaping Use   Vaping Use: Never used  Substance and Sexual Activity   Alcohol use: Yes    Alcohol/week: 4.0 standard drinks of alcohol    Types: 4 Standard drinks or equivalent per week    Comment: wine   Drug use: No   Sexual activity: Yes    Birth control/protection:  Surgical  Other Topics Concern   Not on file  Social History Narrative   Epworth Sleepiness Scale = 8 (as of 07/18/15)   Social Determinants of Health   Financial Resource Strain: Low Risk  (06/21/2022)   Overall Financial Resource Strain (CARDIA)    Difficulty of Paying Living Expenses: Not hard at all  Food Insecurity: No Food Insecurity (06/21/2022)   Hunger Vital Sign    Worried About Running Out of Food in the Last Year: Never true    Ran Out of Food in the Last Year: Never true  Transportation Needs: No Transportation Needs (06/21/2022)   PRAPARE - Administrator, Civil Service  (Medical): No    Lack of Transportation (Non-Medical): No  Physical Activity: Sufficiently Active (06/21/2022)   Exercise Vital Sign    Days of Exercise per Week: 3 days    Minutes of Exercise per Session: 50 min  Stress: No Stress Concern Present (06/21/2022)   [employer] of Occupational Health - Occupational Stress Questionnaire    Feeling of Stress : Not at all  Social Connections: Socially Integrated (06/21/2022)   Social Connection and Isolation Panel [NHANES]    Frequency of Communication with Friends and Family: More than three times a week    Frequency of Social Gatherings with Friends and Family: More than three times a week    Attends Religious Services: More than 4 times per year    Active Member of [employer] or Organizations: Yes    Attends Banker Meetings: More than 4 times per year    Marital Status: Married  Catering manager Violence: Not At Risk (06/21/2022)   Humiliation, Afraid, Rape, and Kick questionnaire    Fear of Current or Ex-Partner: No    Emotionally Abused: No    Physically Abused: No    Sexually Abused: No    Observations/Objective: There were no vitals filed for this visit. Physical Exam Constitutional:      General: She is not in acute distress. Pulmonary:     Comments: Able to speak in full sentences without sounding like she was in distress. Hoarse voice. No coughing during visit.  Neurological:     Mental Status: She is alert and oriented to person, place, and time.  Psychiatric:        Speech: Speech normal.        Behavior: Behavior is cooperative.     Assessment and Plan: COVID -     nirmatrelvir/ritonavir; Take 3 tablets by mouth 2 (two) times daily for 5 days. (Take nirmatrelvir 150 mg two tablets twice daily for 5 days and ritonavir 100 mg one tablet twice daily for 5 days) Patient GFR is 88.50.  Dispense: 30 tablet; Refill: 0  -Prescribed Nirmatrelvir/Ritonavir for home positive covid.  -Discussed about supportative  measures for having covid, such as hydration, taking OTC medication, and vitamin supplements to support immune system. -Advised to go to the closes emergency department if she had chest pain, shortness of breath, or difficulty breathing.  -Discussed CDC guidelines for isolation for covid.   Follow Up Instructions: Follow up if not improved.   I discussed the assessment and treatment plan with the patient. The patient was provided an opportunity to ask questions and all were answered. The patient agreed with the plan and demonstrated an understanding of the instructions.   The patient was advised to call back or seek an in-person evaluation if the symptoms worsen or if the condition fails to improve as anticipated.  Zandra Abts, NP

## 2023-04-09 ENCOUNTER — Other Ambulatory Visit: Payer: Self-pay | Admitting: Family Medicine

## 2023-04-09 DIAGNOSIS — I10 Essential (primary) hypertension: Secondary | ICD-10-CM

## 2023-04-20 ENCOUNTER — Encounter: Payer: Self-pay | Admitting: Family Medicine

## 2023-06-30 ENCOUNTER — Other Ambulatory Visit: Payer: Self-pay | Admitting: Family Medicine

## 2023-06-30 DIAGNOSIS — E785 Hyperlipidemia, unspecified: Secondary | ICD-10-CM

## 2023-07-08 ENCOUNTER — Ambulatory Visit (INDEPENDENT_AMBULATORY_CARE_PROVIDER_SITE_OTHER): Payer: Medicare Other | Admitting: Family Medicine

## 2023-07-08 ENCOUNTER — Encounter: Payer: Self-pay | Admitting: Family Medicine

## 2023-07-08 ENCOUNTER — Other Ambulatory Visit (INDEPENDENT_AMBULATORY_CARE_PROVIDER_SITE_OTHER): Payer: Medicare Other

## 2023-07-08 VITALS — BP 128/70 | HR 80 | Temp 98.3°F | Ht 64.0 in | Wt 192.4 lb

## 2023-07-08 DIAGNOSIS — I10 Essential (primary) hypertension: Secondary | ICD-10-CM | POA: Diagnosis not present

## 2023-07-08 DIAGNOSIS — R300 Dysuria: Secondary | ICD-10-CM | POA: Diagnosis not present

## 2023-07-08 DIAGNOSIS — R8290 Unspecified abnormal findings in urine: Secondary | ICD-10-CM | POA: Diagnosis not present

## 2023-07-08 DIAGNOSIS — R350 Frequency of micturition: Secondary | ICD-10-CM | POA: Diagnosis not present

## 2023-07-08 DIAGNOSIS — R739 Hyperglycemia, unspecified: Secondary | ICD-10-CM

## 2023-07-08 DIAGNOSIS — E785 Hyperlipidemia, unspecified: Secondary | ICD-10-CM | POA: Diagnosis not present

## 2023-07-08 LAB — LIPID PANEL
Cholesterol: 184 mg/dL (ref 0–200)
HDL: 50.2 mg/dL (ref >39.00)
LDL Cholesterol: 105 mg/dL — ABNORMAL HIGH (ref 0–99)
NonHDL: 134.14
Total CHOL/HDL Ratio: 4
Triglycerides: 145 mg/dL (ref 0.0–149.0)
VLDL: 29 mg/dL (ref 0.0–40.0)

## 2023-07-08 LAB — POCT URINALYSIS DIPSTICK
Bilirubin, UA: NEGATIVE
Glucose, UA: NEGATIVE
Ketones, UA: NEGATIVE
Nitrite, UA: NEGATIVE
Protein, UA: NEGATIVE
Spec Grav, UA: 1.015 (ref 1.010–1.025)
Urobilinogen, UA: 0.2 U/dL
pH, UA: 7 (ref 5.0–8.0)

## 2023-07-08 LAB — HEMOGLOBIN A1C: Hgb A1c MFr Bld: 5.9 % (ref 4.6–6.5)

## 2023-07-08 MED ORDER — CEPHALEXIN 500 MG PO CAPS: 500.0000 mg | ORAL_CAPSULE | Freq: Two times a day (BID) | ORAL | 0 refills | Status: AC

## 2023-07-08 NOTE — Patient Instructions (Signed)
Follow up as needed or as scheduled We'll notify you of your lab results and make any changes if needed START the Cephalexin twice daily Drink LOTS of water Call with any questions or concerns Hang in there! Happy Holidays!!!

## 2023-07-08 NOTE — Progress Notes (Signed)
   Subjective:    Patient ID: Shelby Flores, female    DOB: 13-Dec-1945, 77 y.o.   MRN: [id number]  HPI 'i may have a yeast infection'- sxs started '6-8 weeks ago'.  Got OTC Monistat and sxs improved.  Retreated 2-3 weeks later.  Reports odor has a urine.  Has a 'pulling sensation' when urinating.  Denies LBP, no fevers, no N/V, no burning w/ urination.  Very slight itching.  Some increased frequency.  Occasional urgency.  Some hesitancy.     Review of Systems For ROS see HPI     Objective:   Physical Exam Vitals reviewed.  Constitutional:      General: She is not in acute distress.    Appearance: Normal appearance. She is well-developed. She is not ill-appearing.  Abdominal:     General: There is no distension.     Palpations: Abdomen is soft.     Tenderness: There is no abdominal tenderness (no suprapubic or CVA tenderness).  Skin:    General: Skin is warm and dry.  Neurological:     General: No focal deficit present.     Mental Status: She is alert and oriented to person, place, and time.  Psychiatric:        Mood and Affect: Mood normal.        Behavior: Behavior normal.        Thought Content: Thought content normal.           Assessment & Plan:  Urinary frequency and abnormal urine odor- new.  Both of these sxs would point towards UTI.  UA is suspicious for infxn.  Start Keflex while awaiting culture results and send for cytology to r/o yeast.  Pt expressed understanding and is in agreement w/ plan.

## 2023-07-09 LAB — COMPREHENSIVE METABOLIC PANEL
ALT: 28 U/L (ref 0–35)
AST: 22 U/L (ref 0–37)
Albumin: 4.2 g/dL (ref 3.5–5.2)
Alkaline Phosphatase: 57 U/L (ref 39–117)
BUN: 14 mg/dL (ref 6–23)
CO2: 28 meq/L (ref 19–32)
Calcium: 10.3 mg/dL (ref 8.4–10.5)
Chloride: 103 meq/L (ref 96–112)
Creatinine, Ser: 0.72 mg/dL (ref 0.40–1.20)
GFR: 81.32 mL/min (ref >60.00)
Glucose, Bld: 110 mg/dL — ABNORMAL HIGH (ref 70–99)
Potassium: 5.1 meq/L (ref 3.5–5.1)
Sodium: 139 meq/L (ref 135–145)
Total Bilirubin: 1 mg/dL (ref 0.2–1.2)
Total Protein: 6.9 g/dL (ref 6.0–8.3)

## 2023-07-11 ENCOUNTER — Telehealth: Payer: Self-pay

## 2023-07-11 ENCOUNTER — Ambulatory Visit: Payer: Medicare Other | Admitting: *Deleted

## 2023-07-11 DIAGNOSIS — Z0000 Encounter for general adult medical examination without abnormal findings: Secondary | ICD-10-CM

## 2023-07-11 LAB — URINE CULTURE
MICRO NUMBER:: 15744292
SPECIMEN QUALITY:: ADEQUATE

## 2023-07-11 NOTE — Telephone Encounter (Signed)
-----   Message from Neena Rhymes sent at 07/11/2023  9:16 AM EST ----- Your UTI is being treated appropriately- great news!

## 2023-07-11 NOTE — Patient Instructions (Signed)
Shelby Flores , Thank you for taking time to come for your Medicare Wellness Visit. I appreciate your ongoing commitment to your health goals. Please review the following plan we discussed and let me know if I can assist you in the future.   Screening recommendations/referrals: Mammogram: up to date Bone Density: Education provided Recommended yearly ophthalmology/optometry visit for glaucoma screening and checkup Recommended yearly dental visit for hygiene and checkup  Vaccinations: Influenza vaccine: Education provided Pneumococcal vaccine: up to date Tdap vaccine: up to date Shingles vaccine: Education provided    Advanced directives: yes    Preventive Care 65 Years and Older, Female Preventive care refers to lifestyle choices and visits with your health care provider that can promote health and wellness. What does preventive care include? A yearly physical exam. This is also called an annual well check. Dental exams once or twice a year. Routine eye exams. Ask your health care provider how often you should have your eyes checked. Personal lifestyle choices, including: Daily care of your teeth and gums. Regular physical activity. Eating a healthy diet. Avoiding tobacco and drug use. Limiting alcohol use. Practicing safe sex. Taking low-dose aspirin every day. Taking vitamin and mineral supplements as recommended by your health care provider. What happens during an annual well check? The services and screenings done by your health care provider during your annual well check will depend on your age, overall health, lifestyle risk factors, and family history of disease. Counseling  Your health care provider may ask you questions about your: Alcohol use. Tobacco use. Drug use. Emotional well-being. Home and relationship well-being. Sexual activity. Eating habits. History of falls. Memory and ability to understand (cognition). Work and work Astronomer. Reproductive  health. Screening  You may have the following tests or measurements: Height, weight, and BMI. Blood pressure. Lipid and cholesterol levels. These may be checked every 5 years, or more frequently if you are over 48 years old. Skin check. Lung cancer screening. You may have this screening every year starting at age 90 if you have a 30-pack-year history of smoking and currently smoke or have quit within the past 15 years. Fecal occult blood test (FOBT) of the stool. You may have this test every year starting at age 69. Flexible sigmoidoscopy or colonoscopy. You may have a sigmoidoscopy every 5 years or a colonoscopy every 10 years starting at age 62. Hepatitis C blood test. Hepatitis B blood test. Sexually transmitted disease (STD) testing. Diabetes screening. This is done by checking your blood sugar (glucose) after you have not eaten for a while (fasting). You may have this done every 1-3 years. Bone density scan. This is done to screen for osteoporosis. You may have this done starting at age 83. Mammogram. This may be done every 1-2 years. Talk to your health care provider about how often you should have regular mammograms. Talk with your health care provider about your test results, treatment options, and if necessary, the need for more tests. Vaccines  Your health care provider may recommend certain vaccines, such as: Influenza vaccine. This is recommended every year. Tetanus, diphtheria, and acellular pertussis (Tdap, Td) vaccine. You may need a Td booster every 10 years. Zoster vaccine. You may need this after age 38. Pneumococcal 13-valent conjugate (PCV13) vaccine. One dose is recommended after age 65. Pneumococcal polysaccharide (PPSV23) vaccine. One dose is recommended after age 12. Talk to your health care provider about which screenings and vaccines you need and how often you need them. This information is not  intended to replace advice given to you by your health care provider.  Make sure you discuss any questions you have with your health care provider. Document Released: 09/02/2015 Document Revised: 04/25/2016 Document Reviewed: 06/07/2015 Elsevier Interactive Patient Education  2017 [employer].  Fall Prevention in the Home Falls can cause injuries. They can happen to people of all ages. There are many things you can do to make your home safe and to help prevent falls. What can I do on the outside of my home? Regularly fix the edges of walkways and driveways and fix any cracks. Remove anything that might make you trip as you walk through a door, such as a raised step or threshold. Trim any bushes or trees on the path to your home. Use bright outdoor lighting. Clear any walking paths of anything that might make someone trip, such as rocks or tools. Regularly check to see if handrails are loose or broken. Make sure that both sides of any steps have handrails. Any raised decks and porches should have guardrails on the edges. Have any leaves, snow, or ice cleared regularly. Use sand or salt on walking paths during winter. Clean up any spills in your garage right away. This includes oil or grease spills. What can I do in the bathroom? Use night lights. Install grab bars by the toilet and in the tub and shower. Do not use towel bars as grab bars. Use non-skid mats or decals in the tub or shower. If you need to sit down in the shower, use a plastic, non-slip stool. Keep the floor dry. Clean up any water that spills on the floor as soon as it happens. Remove soap buildup in the tub or shower regularly. Attach bath mats securely with double-sided non-slip rug tape. Do not have throw rugs and other things on the floor that can make you trip. What can I do in the bedroom? Use night lights. Make sure that you have a light by your bed that is easy to reach. Do not use any sheets or blankets that are too big for your bed. They should not hang down onto the floor. Have a  firm chair that has side arms. You can use this for support while you get dressed. Do not have throw rugs and other things on the floor that can make you trip. What can I do in the kitchen? Clean up any spills right away. Avoid walking on wet floors. Keep items that you use a lot in easy-to-reach places. If you need to reach something above you, use a strong step stool that has a grab bar. Keep electrical cords out of the way. Do not use floor polish or wax that makes floors slippery. If you must use wax, use non-skid floor wax. Do not have throw rugs and other things on the floor that can make you trip. What can I do with my stairs? Do not leave any items on the stairs. Make sure that there are handrails on both sides of the stairs and use them. Fix handrails that are broken or loose. Make sure that handrails are as long as the stairways. Check any carpeting to make sure that it is firmly attached to the stairs. Fix any carpet that is loose or worn. Avoid having throw rugs at the top or bottom of the stairs. If you do have throw rugs, attach them to the floor with carpet tape. Make sure that you have a light switch at the top of the stairs  and the bottom of the stairs. If you do not have them, ask someone to add them for you. What else can I do to help prevent falls? Wear shoes that: Do not have high heels. Have rubber bottoms. Are comfortable and fit you well. Are closed at the toe. Do not wear sandals. If you use a stepladder: Make sure that it is fully opened. Do not climb a closed stepladder. Make sure that both sides of the stepladder are locked into place. Ask someone to hold it for you, if possible. Clearly mark and make sure that you can see: Any grab bars or handrails. First and last steps. Where the edge of each step is. Use tools that help you move around (mobility aids) if they are needed. These include: Canes. Walkers. Scooters. Crutches. Turn on the lights when you  go into a dark area. Replace any light bulbs as soon as they burn out. Set up your furniture so you have a clear path. Avoid moving your furniture around. If any of your floors are uneven, fix them. If there are any pets around you, be aware of where they are. Review your medicines with your doctor. Some medicines can make you feel dizzy. This can increase your chance of falling. Ask your doctor what other things that you can do to help prevent falls. This information is not intended to replace advice given to you by your health care provider. Make sure you discuss any questions you have with your health care provider. Document Released: 06/02/2009 Document Revised: 01/12/2016 Document Reviewed: 09/10/2014 Elsevier Interactive Patient Education  2017 [employer].

## 2023-07-11 NOTE — Telephone Encounter (Signed)
Caller name: EMMACLAIRE SHUBA  On DPR?: Yes  Call back number: [phone] (mobile)  Provider they see: Shade Flood, MD  Reason for call: Pt returned Tonya's phone call. Pt received lab results and said she would call back if she has any questions

## 2023-07-11 NOTE — Progress Notes (Signed)
Subjective:   Shelby Flores is a 77 y.o. female who presents for Medicare Annual (Subsequent) preventive examination.  Visit Complete: Virtual I connected with  PRABHNOOR PIECHOCKI on 07/11/23 by a audio enabled telemedicine application and verified that I am speaking with the correct person using two identifiers.  Patient Location: Home  Provider Location: Home Office  I discussed the limitations of evaluation and management by telemedicine. The patient expressed understanding and agreed to proceed.  Vital Signs: Because this visit was a virtual/telehealth visit, some criteria may be missing or patient reported. Any vitals not documented were not able to be obtained and vitals that have been documented are patient reported.  Patient Medicare AWV questionnaire was completed by the patient on 07-08-2023; I have confirmed that all information answered by patient is correct and no changes since this date.  Cardiac Risk Factors include: advanced age (>70men, >52 women);obesity (BMI >30kg/m2);family history of premature cardiovascular disease     Objective:    Today's Vitals   07/11/23 1414  PainSc: 2    There is no height or weight on file to calculate BMI.     07/11/2023    2:15 PM 06/21/2022    2:03 PM 01/05/2016    8:24 AM  Advanced Directives  Does Patient Have a Medical Advance Directive? Yes Yes No  Type of Estate agent of [place];Living will Healthcare Power of [place];Living will   Copy of Healthcare Power of Attorney in Chart? No - copy requested No - copy requested   Would patient like information on creating a medical advance directive?   No - patient declined information    Current Medications (verified) Outpatient Encounter Medications as of 07/11/2023  Medication Sig   Acetaminophen (TYLENOL) 325 MG CAPS Take 2 capsules by mouth 3 (three) times daily. Or Ibuprofen   benazepril (LOTENSIN) 10 MG tablet TAKE 1 TABLET(10 MG) BY MOUTH DAILY    cephALEXin (KEFLEX) 500 MG capsule Take 1 capsule (500 mg total) by mouth 2 (two) times daily for 10 doses.   Multiple Vitamin (MULTIVITAMIN) tablet Take 1 tablet by mouth daily.   Naproxen Sodium (ALEVE) 220 MG CAPS Take 1 capsule by mouth daily.   simvastatin (ZOCOR) 10 MG tablet TAKE 1 TABLET BY MOUTH EVERY NIGHT AT BEDTIME   No facility-administered encounter medications on file as of 07/11/2023.    Allergies (verified) Patient has no known allergies.   History: Past Medical History:  Diagnosis Date   Allergy    Atypical chest pain 07/18/2015   HTN (hypertension)    Hyperlipidemia    Myofacial muscle pain    FACIAL   Obesity    Personal history of colonic polyps 2009   Repeat colonoscopy 2014   Vasomotor rhinitis    Past Surgical History:  Procedure Laterality Date   ABDOMINAL HYSTERECTOMY  1994   PARTIAL-fibroids   CHOLECYSTECTOMY  2002   GANGLION CYST EXCISION  2002   lt hand   Family History  Problem Relation Age of Onset   Dystonia Mother        masseter   Hypertension Father    Atrial fibrillation Father        pacemaker   Heart failure Father    Colon polyps Father    Cancer Maternal Grandmother        colon   Lung cancer Sister    Stroke Paternal Grandfather    Cancer Maternal Grandfather    Colon cancer Maternal Grandfather  Colon polyps Brother    Social History   Socioeconomic History   Marital status: Married    Spouse name: Not on file   Number of children: Not on file   Years of education: Not on file   Highest education level: Not on file  Occupational History   Not on file  Tobacco Use   Smoking status: Never   Smokeless tobacco: Never  Vaping Use   Vaping status: Never Used  Substance and Sexual Activity   Alcohol use: Yes    Alcohol/week: 4.0 standard drinks of alcohol    Types: 4 Standard drinks or equivalent per week    Comment: wine   Drug use: No   Sexual activity: Yes    Birth control/protection: Surgical  Other  Topics Concern   Not on file  Social History Narrative   Epworth Sleepiness Scale = 8 (as of 07/18/15)   Social Determinants of Health   Financial Resource Strain: Low Risk  (07/11/2023)   Overall Financial Resource Strain (CARDIA)    Difficulty of Paying Living Expenses: Not hard at all  Food Insecurity: No Food Insecurity (07/11/2023)   Hunger Vital Sign    Worried About Running Out of Food in the Last Year: Never true    Ran Out of Food in the Last Year: Never true  Transportation Needs: No Transportation Needs (07/11/2023)   PRAPARE - Administrator, Civil Service (Medical): No    Lack of Transportation (Non-Medical): No  Physical Activity: Insufficiently Active (07/11/2023)   Exercise Vital Sign    Days of Exercise per Week: 3 days    Minutes of Exercise per Session: 40 min  Stress: No Stress Concern Present (07/11/2023)   [employer] of Occupational Health - Occupational Stress Questionnaire    Feeling of Stress : Only a little  Social Connections: Socially Integrated (07/11/2023)   Social Connection and Isolation Panel [NHANES]    Frequency of Communication with Friends and Family: More than three times a week    Frequency of Social Gatherings with Friends and Family: Twice a week    Attends Religious Services: More than 4 times per year    Active Member of [employer] or Organizations: Yes    Attends Engineer, structural: More than 4 times per year    Marital Status: Married    Tobacco Counseling Counseling given: Not Answered   Clinical Intake:  Pre-visit preparation completed: Yes  Pain : 0-10 Pain Score: 2  Pain Type: Acute pain Pain Location: Leg Pain Orientation: Left Pain Descriptors / Indicators: Burning, Aching Pain Onset: Yesterday Pain Frequency: Intermittent        How often do you need to have someone help you when you read instructions, pamphlets, or other written materials from your doctor or pharmacy?: 1 -  Never  Interpreter Needed?: No  Information entered by :: Remi Haggard LPN   Activities of Daily Living    07/11/2023    2:17 PM 07/08/2023    2:04 PM  In your present state of health, do you have any difficulty performing the following activities:  Hearing? 0 0  Vision? 0 0  Difficulty concentrating or making decisions? 0 0  Walking or climbing stairs? 0 0  Dressing or bathing? 0 0  Doing errands, shopping? 0 0  Preparing Food and eating ? N N  Using the Toilet? N N  In the past six months, have you accidently leaked urine? Malvin Johns  Do you have problems  with loss of bowel control? N N  Managing your Medications? N N  Managing your Finances?  N  Housekeeping or managing your Housekeeping?  N    Patient Care Team: Shade Flood, MD as PCP - General (Family Medicine) Erlene Senters, MD as Referring Physician (Orthopedic Surgery) Chilton Si, MD as Attending Physician (Cardiology)  Indicate any recent Medical Services you may have received from other than [facility] providers in the past year (date may be approximate).     Assessment:   This is a routine wellness examination for [place].  Hearing/Vision screen Hearing Screening - Comments:: No trouble hearing Vision Screening - Comments:: Up to date [facility]   Goals Addressed             This Visit's Progress    Patient Stated       Continue current lifestyle       Depression Screen    07/11/2023    2:18 PM 07/08/2023   10:54 AM 12/21/2022    1:47 PM 06/25/2022    8:10 AM 11/13/2021    4:03 PM 07/06/2020    2:31 PM 04/17/2019    1:53 PM  PHQ 2/9 Scores  PHQ - 2 Score 1 0 0 0 0 0 0  PHQ- 9 Score 1 0 0        Fall Risk    07/11/2023    2:14 PM 07/08/2023    2:04 PM 07/08/2023   10:54 AM 12/21/2022    1:47 PM 06/21/2022    2:00 PM  Fall Risk   Falls in the past year? 0 0 0 0 0  Number falls in past yr: 0  0  0  Injury with Fall? 0 0 0  0  Risk for fall due to :   No Fall Risks No Fall Risks No Fall Risks   Follow up Falls evaluation completed;Education provided;Falls prevention discussed  Falls evaluation completed  Falls prevention discussed    MEDICARE RISK AT HOME: Medicare Risk at Home Any stairs in or around the home?: Yes If so, are there any without handrails?: No Home free of loose throw rugs in walkways, pet beds, electrical cords, etc?: Yes Adequate lighting in your home to reduce risk of falls?: Yes Life alert?: No Use of a cane, walker or w/c?: No Grab bars in the bathroom?: No Shower chair or bench in shower?: No Elevated toilet seat or a handicapped toilet?: No  TIMED UP AND GO:  Was the test performed?  No    Cognitive Function:        07/11/2023    2:17 PM 06/21/2022    2:02 PM  6CIT Screen  What Year? 0 points 0 points  What month? 0 points 0 points  What time? 0 points 0 points  Count back from 20 0 points 0 points  Months in reverse 0 points 0 points  Repeat phrase 0 points 0 points  Total Score 0 points 0 points    Immunizations Immunization History  Administered Date(s) Administered   Fluad Quad(high Dose 65+) 04/17/2019   Influenza, High Dose Seasonal PF 10/24/2018   Influenza, Seasonal, Injecte, Preservative Fre 09/16/2012   Influenza,inj,Quad PF,6+ Mos 06/22/2015, 09/18/2016   Influenza-Unspecified 06/21/2020, 05/30/2022   PFIZER(Purple Top)SARS-COV-2 Vaccination 09/10/2019, 10/01/2019, 06/21/2020   Pneumococcal Conjugate-13 10/18/2014   Pneumococcal Polysaccharide-23 09/18/2016   Tdap 01/18/2014    TDAP status: Up to date  Flu Vaccine status: Due, Education has been provided regarding the importance of this vaccine.  Advised may receive this vaccine at local pharmacy or Health Dept. Aware to provide a copy of the vaccination record if obtained from local pharmacy or Health Dept. Verbalized acceptance and understanding.  Pneumococcal vaccine status: Up to date  Covid-19 vaccine status: Information provided on how to obtain vaccines.    Qualifies for Shingles Vaccine? Yes   Zostavax completed No   Shingrix Completed?: No.    Education has been provided regarding the importance of this vaccine. Patient has been advised to call insurance company to determine out of pocket expense if they have not yet received this vaccine. Advised may also receive vaccine at local pharmacy or Health Dept. Verbalized acceptance and understanding.  Screening Tests Health Maintenance  Topic Date Due   COVID-19 Vaccine (4 - 2023-24 season) 07/27/2023 (Originally 04/21/2023)   Zoster Vaccines- Shingrix (1 of 2) 10/11/2023 (Originally 04/12/1997)   INFLUENZA VACCINE  11/18/2023 (Originally 03/21/2023)   DTaP/Tdap/Td (2 - Td or Tdap) 01/19/2024   Medicare Annual Wellness (AWV)  07/10/2024   Pneumonia Vaccine 7+ Years old  Completed   DEXA SCAN  Completed   Hepatitis C Screening  Completed   HPV VACCINES  Aged Out   Colonoscopy  Discontinued    Health Maintenance  There are no preventive care reminders to display for this patient.   Colorectal cancer screening: No longer required.   Mammogram status: Completed  . Repeat every year  Bone Density status: Completed 2021. Results reflect: Bone density results: OSTEOPENIA. Repeat every 5 years.  Lung Cancer Screening: (Low Dose CT Chest recommended if Age 43-80 years, 20 pack-year currently smoking OR have quit w/in 15years.) does not qualify.   Lung Cancer Screening Referral:   Additional Screening:  Hepatitis C Screening: does not qualify; Completed 2016  Vision Screening: Recommended annual ophthalmology exams for early detection of glaucoma and other disorders of the eye. Is the patient up to date with their annual eye exam?  Yes  Who is the provider or what is the name of the office in which the patient attends annual eye exams? [facility] If pt is not established with a provider, would they like to be referred to a provider to establish care? No .   Dental Screening: Recommended annual  dental exams for proper oral hygiene   Community Resource Referral / Chronic Care Management: CRR required this visit?  No   CCM required this visit?  No     Plan:     I have personally reviewed and noted the following in the patient's chart:   Medical and social history Use of alcohol, tobacco or illicit drugs  Current medications and supplements including opioid prescriptions. Patient is not currently taking opioid prescriptions. Functional ability and status Nutritional status Physical activity Advanced directives List of other physicians Hospitalizations, surgeries, and ER visits in previous 12 months Vitals Screenings to include cognitive, depression, and falls Referrals and appointments  In addition, I have reviewed and discussed with patient certain preventive protocols, quality metrics, and best practice recommendations. A written personalized care plan for preventive services as well as general preventive health recommendations were provided to patient.     Remi Haggard, LPN   [id number]   After Visit Summary: (MyChart) Due to this being a telephonic visit, the after visit summary with patients personalized plan was offered to patient via MyChart   Nurse Notes:

## 2023-07-11 NOTE — Telephone Encounter (Signed)
Noted  

## 2023-07-29 ENCOUNTER — Encounter: Payer: Self-pay | Admitting: Family Medicine

## 2023-07-29 ENCOUNTER — Ambulatory Visit: Payer: Medicare Other | Admitting: Family Medicine

## 2023-07-29 VITALS — BP 122/70 | HR 77 | Temp 97.8°F | Ht 64.0 in | Wt 195.2 lb

## 2023-07-29 DIAGNOSIS — I10 Essential (primary) hypertension: Secondary | ICD-10-CM

## 2023-07-29 DIAGNOSIS — M79645 Pain in left finger(s): Secondary | ICD-10-CM | POA: Diagnosis not present

## 2023-07-29 DIAGNOSIS — M79652 Pain in left thigh: Secondary | ICD-10-CM | POA: Diagnosis not present

## 2023-07-29 DIAGNOSIS — M7662 Achilles tendinitis, left leg: Secondary | ICD-10-CM | POA: Diagnosis not present

## 2023-07-29 DIAGNOSIS — E785 Hyperlipidemia, unspecified: Secondary | ICD-10-CM

## 2023-07-29 DIAGNOSIS — Z23 Encounter for immunization: Secondary | ICD-10-CM

## 2023-07-29 NOTE — Patient Instructions (Signed)
Thank you for coming in today.  I think you probably had some Achilles tendinitis as the ankle pain which then may have changed to your motion to cause some of the soreness in the left thigh.  As the heel pain has improved, I do not think any specific treatment or x-rays are needed at this time as long as that continues to improve but see information below.  Temporary heel lift may also be helpful.  For the thigh pain, gentle stretches, range of motion, topical treatments such as Biofreeze are fine for now or even temporary Voltaren gel.  As long as that is improving we can recheck in 1 month.  I will refer you to hand specialist for the left third finger pain.  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  Thank you for coming in today and let me know if there are questions in the meantime.    Achilles Tendinitis  Achilles tendinitis is inflammation of the tough, cord-like band that connects the lower leg muscles to the heel bone (Achilles tendon). This is often caused by using the tendon and ankle joint too much. In most cases, Achilles tendinitis gets better over time with treatment and home care. It can take weeks or months to fully heal. What are the causes? This condition may be caused by: A sudden increase in exercise or activity, such as running. Doing the same exercises or activities, such as jumping, over and over. Not warming up your calf muscles before you exercise. Exercising in shoes that are worn out or not made for exercise. Having arthritis or a bone growth (spur) on the back of your heel. This can rub against the tendon and hurt it. Age-related wear and tear. Tendons become less flexible with age and are more likely to be injured. What are the signs or symptoms? Common symptoms of this condition include: Pain in your Achilles tendon or in the back of your leg, just above your heel. The pain may get worse when  you exercise. Stiffness or soreness in the back of your leg. You may feel it most often in the morning. Swelling of the skin over the Achilles tendon. Thickening of the tendon. Trouble standing on tiptoe. How is this diagnosed? This condition is diagnosed based on your symptoms and a physical exam. You may also have tests, such as: X-rays. MRI. Ultrasound. How is this treated? The goal of treatment is to relieve symptoms and help your injury heal. You may need to: Decrease or stop activities that caused the tendinitis. You may be told to switch to low-impact exercises like biking or swimming. Ice the injured area. Do physical therapy. This may include strengthening and stretching exercises. Take NSAIDs, such as ibuprofen. These can help with pain and swelling. Use supportive shoes, wraps, heel lifts, or a walking boot (air cast). Have surgery. This may be done if your symptoms do not get better with other treatments. Use high-energy waves to start the healing process (extracorporeal shock wave therapy). This is rare. Get an injection of medicines that help with inflammation (corticosteroids). This is rare. Follow these instructions at home: If you have an air cast: Wear the air cast as told by your health care provider. Remove it only as told by your provider. Check the skin around the air cast every day. Tell your provider about any concerns. Loosen the air cast if your toes tingle, become numb, or turn cold  and blue. Keep the air cast clean. If the air cast is not waterproof: Do not let it get wet. Cover it with a watertight covering when you take a bath or shower. Managing pain, stiffness, and swelling  If told, put ice on the injured area. If you have a removable air cast, remove it as told by your provider. Put ice in a plastic bag. Place a towel between your skin and the bag. Leave the ice on for 20 minutes, 2-3 times a day. If your skin turns bright red, remove the ice  right away to prevent skin damage. The risk of damage is higher if you cannot feel pain, heat, or cold. Move your toes often to reduce stiffness and swelling. Raise (elevate) your foot above the level of your heart while you are sitting or lying down. Activity Do not do activities that cause pain. Ask your provider when it is safe to drive if you have an air cast on your foot. If you go to physical therapy, do exercises as told by your provider or therapist. Return to your normal activities as told by your provider. Ask your provider what activities are safe for you. General instructions Take over-the-counter and prescription medicines only as told by your provider. If told, wrap your foot with an elastic bandage or other wrap. This can help to keep your tendon from moving too much while it heals. Your provider will show you how to wrap your foot. Wear supportive shoes or heel lifts only as told by your provider. Contact a health care provider if: Your symptoms get worse. Your pain does not get better with medicine. You have new symptoms that you cannot explain. You have warmth and swelling in your foot. You have a fever. Get help right away if: You hear a sudden popping sound in your Achilles tendon and then have severe pain. You cannot move your toes or foot. You cannot put any weight on your foot. Your foot or toes become numb and look white or blue even after you loosen your bandage or air cast. This information is not intended to replace advice given to you by your health care provider. Make sure you discuss any questions you have with your health care provider. Document Revised: 04/27/2022 Document Reviewed: 04/27/2022 Elsevier Patient Education  2024 [employer].

## 2023-07-29 NOTE — Progress Notes (Signed)
Subjective:  Patient ID: Shelby Flores, female    DOB: 02/21/1946  Age: 77 y.o. MRN: [id number]  CC:  Chief Complaint  Patient presents with   Ankle Pain    Lt ankle pain for a few months,sharp pains, notes it seems to be a bit better today, but notes she is now having some upper thigh pain near to the hip (Lt)   Hand Pain    Pt notes having difficulty in her trigger finger of the Lt hand notes it is effecting her ability to write.      HPI Shelby Flores presents for multiple concerns above.  Last visit in January and advised 6-week follow-up at that time.  Has not been seen since that time.  Of note she did see my colleague for urinary symptoms November 18.  Labs drawn at that time.  Left ankle pain Past few months. NKI. Started out of the blue. Exercising at gym, but no new activities. Pain at back of achilles and back of ankle. Felt inflamed and sore to touch. Better past few weeks. Sharp pains - only episodic now. No further redness or warmth. No weakness.  Now some intermittent pain in her upper L thigh - outside of left thigh - NKI. Feels like muscle strain. No rash, swelling/redness. No groin/hip pain. No treatment. Mild discomfort.   Left hand pain Middle finger in left hand - IP joint was locking for about a year.  NKI. Trouble bending that finger past 6 months. No treatments.  L hand dominant.   Prediabetes: Plan for diet, activity approach initially with 56-month follow-up based on November 18 labs. Lab Results  Component Value Date   HGBA1C 5.9 07/08/2023   Wt Readings from Last 3 Encounters:  07/29/23 195 lb 3.2 oz (88.5 kg)  07/08/23 192 lb 6 oz (87.3 kg)  08/29/22 199 lb 9.6 oz (90.5 kg)   Oromandibular dystonia Prior care under Dr. Leanna Battles. Ongoing sx's, prior gamma knife treatment - minimal improvement. No change with Botox, and may have made swallowing worse.  Last neuro eval in 2023. Plans to follow up to discuss further in next month.    Hypertension: Continues on Lotensin 10 mg daily.  Recent labs noted. Home readings:none  BP Readings from Last 3 Encounters:  07/29/23 122/70  07/08/23 128/70  08/29/22 110/64   Lab Results  Component Value Date   CREATININE 0.72 07/08/2023   Hyperlipidemia: Simvastatin 10 mg daily.  Recent labs noted. Lab Results  Component Value Date   CHOL 184 07/08/2023   HDL 50.20 07/08/2023   LDLCALC 105 (H) 07/08/2023   LDLDIRECT 156.0 01/04/2021   TRIG 145.0 07/08/2023   CHOLHDL 4 07/08/2023   Lab Results  Component Value Date   ALT 28 07/08/2023   AST 22 07/08/2023   ALKPHOS 57 07/08/2023   BILITOT 1.0 07/08/2023       History Patient Active Problem List   Diagnosis Date Noted   Oromandibular dystonia 02/12/2022   Osteopenia 07/31/2017   Essential tremor 10/18/2014   Vasomotor rhinitis    HTN (hypertension)    Hyperlipidemia    Obesity    Myofascial muscle pain    History of colonic polyps    Past Medical History:  Diagnosis Date   Allergy    Atypical chest pain 07/18/2015   HTN (hypertension)    Hyperlipidemia    Myofacial muscle pain    FACIAL   Obesity    Personal history of colonic polyps 2009  Repeat colonoscopy 2014   Vasomotor rhinitis    Past Surgical History:  Procedure Laterality Date   ABDOMINAL HYSTERECTOMY  1994   PARTIAL-fibroids   CHOLECYSTECTOMY  2002   GANGLION CYST EXCISION  2002   lt hand   No Known Allergies Prior to Admission medications   Medication Sig Start Date End Date Taking? Authorizing Provider  Acetaminophen (TYLENOL) 325 MG CAPS Take 2 capsules by mouth 3 (three) times daily. Or Ibuprofen   Yes [provider]  benazepril (LOTENSIN) 10 MG tablet TAKE 1 TABLET(10 MG) BY MOUTH DAILY 04/09/23  Yes Sheliah Hatch, MD  Multiple Vitamin (MULTIVITAMIN) tablet Take 1 tablet by mouth daily.   Yes [provider]  Naproxen Sodium (ALEVE) 220 MG CAPS Take 1 capsule by mouth daily.   Yes [provider]  simvastatin (ZOCOR) 10 MG tablet TAKE 1 TABLET BY MOUTH EVERY NIGHT AT BEDTIME 07/01/23  Yes Shade Flood, MD   Social History   Socioeconomic History   Marital status: Married    Spouse name: Not on file   Number of children: Not on file   Years of education: Not on file   Highest education level: Not on file  Occupational History   Not on file  Tobacco Use   Smoking status: Never   Smokeless tobacco: Never  Vaping Use   Vaping status: Never Used  Substance and Sexual Activity   Alcohol use: Yes    Alcohol/week: 4.0 standard drinks of alcohol    Types: 4 Standard drinks or equivalent per week    Comment: wine   Drug use: No   Sexual activity: Yes    Birth control/protection: Surgical  Other Topics Concern   Not on file  Social History Narrative   Epworth Sleepiness Scale = 8 (as of 07/18/15)   Social Determinants of Health   Financial Resource Strain: Low Risk  (07/11/2023)   Overall Financial Resource Strain (CARDIA)    Difficulty of Paying Living Expenses: Not hard at all  Food Insecurity: No Food Insecurity (07/11/2023)   Hunger Vital Sign    Worried About Running Out of Food in the Last Year: Never true    Ran Out of Food in the Last Year: Never true  Transportation Needs: No Transportation Needs (07/11/2023)   PRAPARE - Administrator, Civil Service (Medical): No    Lack of Transportation (Non-Medical): No  Physical Activity: Insufficiently Active (07/11/2023)   Exercise Vital Sign    Days of Exercise per Week: 3 days    Minutes of Exercise per Session: 40 min  Stress: No Stress Concern Present (07/11/2023)   [employer] of Occupational Health - Occupational Stress Questionnaire    Feeling of Stress : Only a little  Social Connections: Socially Integrated (07/11/2023)   Social Connection and Isolation Panel [NHANES]    Frequency of Communication with Friends and Family: More than three times a week    Frequency of  Social Gatherings with Friends and Family: Twice a week    Attends Religious Services: More than 4 times per year    Active Member of [employer] or Organizations: Yes    Attends Engineer, structural: More than 4 times per year    Marital Status: Married  Catering manager Violence: Not At Risk (07/11/2023)   Humiliation, Afraid, Rape, and Kick questionnaire    Fear of Current or Ex-Partner: No    Emotionally Abused: No    Physically Abused: No  Sexually Abused: No    Review of Systems  Constitutional:  Negative for fatigue and unexpected weight change.  Respiratory:  Negative for chest tightness and shortness of breath.   Cardiovascular:  Negative for chest pain, palpitations and leg swelling.  Gastrointestinal:  Negative for abdominal pain and blood in stool.  Neurological:  Negative for dizziness, syncope, light-headedness and headaches.   Per HPI.   Objective:   Vitals:   07/29/23 1421  BP: 122/70  Pulse: 77  Temp: 97.8 F (36.6 C)  TempSrc: Temporal  SpO2: 97%  Weight: 195 lb 3.2 oz (88.5 kg)  Height: 5\' 4"  (1.626 m)     Physical Exam Vitals reviewed.  Constitutional:      Appearance: Normal appearance. She is well-developed.  HENT:     Head: Normocephalic and atraumatic.  Eyes:     Conjunctiva/sclera: Conjunctivae normal.     Pupils: Pupils are equal, round, and reactive to light.  Neck:     Vascular: No carotid bruit.  Cardiovascular:     Rate and Rhythm: Normal rate and regular rhythm.     Heart sounds: Normal heart sounds.  Pulmonary:     Effort: Pulmonary effort is normal.     Breath sounds: Normal breath sounds.  Abdominal:     Palpations: Abdomen is soft. There is no pulsatile mass.     Tenderness: There is no abdominal tenderness.  Musculoskeletal:     Right lower leg: No edema.     Left lower leg: No edema.     Comments: Left hip, pain-free range of motion.  Trochanteric bursa nontender.  Minimal discomfort over lateral thigh without focal  bony tenderness.  Knee nontender.  Calf nontender without edema.  Left ankle, minimal discomfort at Achilles insertion without defect, soft tissue swelling, erythema.  Retrocalcaneal bursa nontender.  No apparent effusion.  Remainder of ankle nontender.  Left hand, no apparent triggering of middle finger but slight discomfort over the volar 3rd MCP, primarily discomfort of the PIP with limited flexion to 45 degrees of the PIP compared to almost 90 had her contralateral side.  Equal DIP and MCP motion of the third phalanx bilaterally.  Neurovascular intact distally.  Skin:    General: Skin is warm and dry.  Neurological:     Mental Status: She is alert and oriented to person, place, and time.  Psychiatric:        Mood and Affect: Mood normal.        Behavior: Behavior normal.        Assessment & Plan:  MALEAH TREICHLER is a 77 y.o. female . Finger pain, left - Plan: Ambulatory referral to Hand Surgery  -Does not have typical trigger finger symptoms at this time.  Suspect she may have a component of PIP arthritis, with progressive decreased motion.  Will refer to hand specialist to decide on imaging, injection or other treatment.   Flu vaccine need - Plan: Flu Vaccine Trivalent High Dose (Fluad)  Left thigh pain  -Suspect muscular pain secondary to favoring of her left ankle, which is now improved.  Symptomatic care discussed with RTC precautions if persistent.  Without specific injury or bony pain hold on imaging for now.  Achilles tendinitis of left lower extremity  -Insertional Achilles tendinosis likely based on location of pain and symptoms.  Differential includes retrocalcaneal bursitis with prior swelling, but improved.  Handout given, option of temporary heel lift if needed, RTC precautions if not continuing to improve.  Plans to follow-up  to discuss her oromandibular dystonia and treatment plan moving forward as previous neurologist left prior facility.  Will need to review her  previous treatment plan and plans from neurology, with consideration of second opinion if needed.  No orders of the defined types were placed in this encounter.  Patient Instructions  Thank you for coming in today.  I think you probably had some Achilles tendinitis as the ankle pain which then may have changed to your motion to cause some of the soreness in the left thigh.  As the heel pain has improved, I do not think any specific treatment or x-rays are needed at this time as long as that continues to improve but see information below.  Temporary heel lift may also be helpful.  For the thigh pain, gentle stretches, range of motion, topical treatments such as Biofreeze are fine for now or even temporary Voltaren gel.  As long as that is improving we can recheck in 1 month.  I will refer you to hand specialist for the left third finger pain.  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  Thank you for coming in today and let me know if there are questions in the meantime.    Achilles Tendinitis  Achilles tendinitis is inflammation of the tough, cord-like band that connects the lower leg muscles to the heel bone (Achilles tendon). This is often caused by using the tendon and ankle joint too much. In most cases, Achilles tendinitis gets better over time with treatment and home care. It can take weeks or months to fully heal. What are the causes? This condition may be caused by: A sudden increase in exercise or activity, such as running. Doing the same exercises or activities, such as jumping, over and over. Not warming up your calf muscles before you exercise. Exercising in shoes that are worn out or not made for exercise. Having arthritis or a bone growth (spur) on the back of your heel. This can rub against the tendon and hurt it. Age-related wear and tear. Tendons become less flexible with age and are more likely to be  injured. What are the signs or symptoms? Common symptoms of this condition include: Pain in your Achilles tendon or in the back of your leg, just above your heel. The pain may get worse when you exercise. Stiffness or soreness in the back of your leg. You may feel it most often in the morning. Swelling of the skin over the Achilles tendon. Thickening of the tendon. Trouble standing on tiptoe. How is this diagnosed? This condition is diagnosed based on your symptoms and a physical exam. You may also have tests, such as: X-rays. MRI. Ultrasound. How is this treated? The goal of treatment is to relieve symptoms and help your injury heal. You may need to: Decrease or stop activities that caused the tendinitis. You may be told to switch to low-impact exercises like biking or swimming. Ice the injured area. Do physical therapy. This may include strengthening and stretching exercises. Take NSAIDs, such as ibuprofen. These can help with pain and swelling. Use supportive shoes, wraps, heel lifts, or a walking boot (air cast). Have surgery. This may be done if your symptoms do not get better with other treatments. Use high-energy waves to start the healing process (extracorporeal shock wave therapy). This is rare. Get an injection of medicines that help with inflammation (corticosteroids). This is rare. Follow these instructions at home:  If you have an air cast: Wear the air cast as told by your health care provider. Remove it only as told by your provider. Check the skin around the air cast every day. Tell your provider about any concerns. Loosen the air cast if your toes tingle, become numb, or turn cold and blue. Keep the air cast clean. If the air cast is not waterproof: Do not let it get wet. Cover it with a watertight covering when you take a bath or shower. Managing pain, stiffness, and swelling  If told, put ice on the injured area. If you have a removable air cast, remove it as told  by your provider. Put ice in a plastic bag. Place a towel between your skin and the bag. Leave the ice on for 20 minutes, 2-3 times a day. If your skin turns bright red, remove the ice right away to prevent skin damage. The risk of damage is higher if you cannot feel pain, heat, or cold. Move your toes often to reduce stiffness and swelling. Raise (elevate) your foot above the level of your heart while you are sitting or lying down. Activity Do not do activities that cause pain. Ask your provider when it is safe to drive if you have an air cast on your foot. If you go to physical therapy, do exercises as told by your provider or therapist. Return to your normal activities as told by your provider. Ask your provider what activities are safe for you. General instructions Take over-the-counter and prescription medicines only as told by your provider. If told, wrap your foot with an elastic bandage or other wrap. This can help to keep your tendon from moving too much while it heals. Your provider will show you how to wrap your foot. Wear supportive shoes or heel lifts only as told by your provider. Contact a health care provider if: Your symptoms get worse. Your pain does not get better with medicine. You have new symptoms that you cannot explain. You have warmth and swelling in your foot. You have a fever. Get help right away if: You hear a sudden popping sound in your Achilles tendon and then have severe pain. You cannot move your toes or foot. You cannot put any weight on your foot. Your foot or toes become numb and look white or blue even after you loosen your bandage or air cast. This information is not intended to replace advice given to you by your health care provider. Make sure you discuss any questions you have with your health care provider. Document Revised: 04/27/2022 Document Reviewed: 04/27/2022 Elsevier Patient Education  2024 [employer].      Signed,   Meredith Staggers, MD [facility], [facility] 07/29/23 3:19 PM

## 2023-09-05 ENCOUNTER — Ambulatory Visit: Payer: Medicare Other | Admitting: Family Medicine

## 2023-09-20 LAB — HM MAMMOGRAPHY

## 2023-10-08 ENCOUNTER — Encounter: Payer: Self-pay | Admitting: Family Medicine

## 2023-10-14 ENCOUNTER — Ambulatory Visit: Payer: Medicare Other | Admitting: Family Medicine

## 2023-11-18 ENCOUNTER — Ambulatory Visit: Payer: Medicare Other | Admitting: Family Medicine

## 2023-12-03 DIAGNOSIS — M65332 Trigger finger, left middle finger: Secondary | ICD-10-CM | POA: Insufficient documentation

## 2023-12-03 DIAGNOSIS — M79645 Pain in left finger(s): Secondary | ICD-10-CM | POA: Insufficient documentation

## 2023-12-05 ENCOUNTER — Ambulatory Visit: Admitting: Family Medicine

## 2023-12-26 ENCOUNTER — Encounter (HOSPITAL_COMMUNITY): Payer: Self-pay

## 2024-01-02 ENCOUNTER — Ambulatory Visit: Admitting: Family Medicine

## 2024-02-14 ENCOUNTER — Other Ambulatory Visit: Payer: Self-pay | Admitting: Family Medicine

## 2024-02-14 DIAGNOSIS — E785 Hyperlipidemia, unspecified: Secondary | ICD-10-CM

## 2024-02-14 NOTE — Telephone Encounter (Signed)
 Labs noted in November of last year.  It looks like she has had to cancel a few appointments but has an appointment scheduled with me on June 30.  I will refill medications temporarily for now.

## 2024-02-14 NOTE — Telephone Encounter (Signed)
 Pt has follow up on Monday   Requested Prescriptions   Pending Prescriptions Disp Refills   simvastatin  (ZOCOR ) 10 MG tablet [Pharmacy Med Name: SIMVASTATIN  10MG  TABLETS] 90 tablet 1    Sig: TAKE 1 TABLET BY MOUTH EVERY NIGHT AT BEDTIME     Date of patient request: 02/14/2024 Last office visit: 07/29/2023 Upcoming visit: 02/17/2024 Date of last refill: 07/01/2023 Last refill amount: 90

## 2024-02-17 ENCOUNTER — Ambulatory Visit (INDEPENDENT_AMBULATORY_CARE_PROVIDER_SITE_OTHER): Admitting: Family Medicine

## 2024-02-17 VITALS — BP 122/60 | HR 72 | Temp 97.9°F | Resp 17 | Ht 64.0 in | Wt 197.6 lb

## 2024-02-17 DIAGNOSIS — L723 Sebaceous cyst: Secondary | ICD-10-CM | POA: Insufficient documentation

## 2024-02-17 DIAGNOSIS — D1801 Hemangioma of skin and subcutaneous tissue: Secondary | ICD-10-CM | POA: Insufficient documentation

## 2024-02-17 DIAGNOSIS — I10 Essential (primary) hypertension: Secondary | ICD-10-CM

## 2024-02-17 DIAGNOSIS — Z87898 Personal history of other specified conditions: Secondary | ICD-10-CM | POA: Insufficient documentation

## 2024-02-17 DIAGNOSIS — R519 Headache, unspecified: Secondary | ICD-10-CM | POA: Diagnosis not present

## 2024-02-17 DIAGNOSIS — E785 Hyperlipidemia, unspecified: Secondary | ICD-10-CM

## 2024-02-17 DIAGNOSIS — R7303 Prediabetes: Secondary | ICD-10-CM

## 2024-02-17 DIAGNOSIS — Z85820 Personal history of malignant melanoma of skin: Secondary | ICD-10-CM | POA: Insufficient documentation

## 2024-02-17 DIAGNOSIS — M7662 Achilles tendinitis, left leg: Secondary | ICD-10-CM

## 2024-02-17 DIAGNOSIS — L814 Other melanin hyperpigmentation: Secondary | ICD-10-CM | POA: Insufficient documentation

## 2024-02-17 DIAGNOSIS — C4360 Malignant melanoma of unspecified upper limb, including shoulder: Secondary | ICD-10-CM | POA: Insufficient documentation

## 2024-02-17 DIAGNOSIS — G8929 Other chronic pain: Secondary | ICD-10-CM

## 2024-02-17 DIAGNOSIS — L820 Inflamed seborrheic keratosis: Secondary | ICD-10-CM | POA: Insufficient documentation

## 2024-02-17 DIAGNOSIS — L821 Other seborrheic keratosis: Secondary | ICD-10-CM | POA: Insufficient documentation

## 2024-02-17 DIAGNOSIS — F439 Reaction to severe stress, unspecified: Secondary | ICD-10-CM

## 2024-02-17 LAB — HEMOGLOBIN A1C: Hgb A1c MFr Bld: 6 % (ref 4.6–6.5)

## 2024-02-17 MED ORDER — BENAZEPRIL HCL 10 MG PO TABS: 10.0000 mg | ORAL_TABLET | Freq: Every day | ORAL | 2 refills | Status: AC

## 2024-02-17 MED ORDER — SIMVASTATIN 10 MG PO TABS: 10.0000 mg | ORAL_TABLET | Freq: Every day | ORAL | 2 refills | Status: AC

## 2024-02-17 NOTE — Progress Notes (Unsigned)
 Subjective:  Patient ID: Shelby Flores, female    DOB: [birth year]  Age: 78 y.o. MRN: [id number]  CC:  Chief Complaint  Patient presents with   Leg Pain    Pt notes does still have heel pain has been stretching and has improved some but still bothersome, thight pain has resolved, pt does want to know when her annual is     HPI Marty K Seward presents for  Follow-up, last visit in December with plan for 1 month follow-up.  Some situational stressors with health of spouse and brother.  Member of [employer] - plans to return to gym, goal of 3 days per week.   Left Achilles tendinitis Insertional Achilles tendinosis suspected on her exam last December and based on her symptoms.  Differential included retrocalcaneal bursitis with prior swelling but that had improved.  Option of temporary heel lift if needed and RTC precautions if not continuing to improve. She has been doing some stretching, some improvement, but still with some intermittent heel pain overall better.   Prediabetes: Diet/exercise approach. Lab Results  Component Value Date   HGBA1C 5.9 07/08/2023   Wt Readings from Last 3 Encounters:  02/17/24 197 lb 9.6 oz (89.6 kg)  07/29/23 195 lb 3.2 oz (88.5 kg)  07/08/23 192 lb 6 oz (87.3 kg)   Hypertension: Lotensin  10 mg daily.  Last labs in November.  Denies any new side effects from meds. On total beet supplement.   Home readings:  BP Readings from Last 3 Encounters:  02/17/24 122/60  07/29/23 122/70  07/08/23 128/70   Lab Results  Component Value Date   CREATININE 0.72 07/08/2023   Hyperlipidemia: Simvastatin  10 mg daily.  LDL 105 in November. No new side effects.  Lab Results  Component Value Date   CHOL 184 07/08/2023   HDL 50.20 07/08/2023   LDLCALC 105 (H) 07/08/2023   LDLDIRECT 156.0 01/04/2021   TRIG 145.0 07/08/2023   CHOLHDL 4 07/08/2023   Lab Results  Component Value Date   ALT 28 07/08/2023   AST 22 07/08/2023   ALKPHOS 57 07/08/2023   BILITOT  1.0 07/08/2023   Oral mandibular dystonia Briefly discussed at her December visit.  Prior care under Dr. Orion with prior gamma knife treatment with minimal improvement.  No change in Botox.  Previous neuro eval was in 2023.   I do see an appointment with neurology in February.  History of trigeminal schwannoma with gamma knife radiation 04/06/2021.  Persistent V1-V3 pain occasionally in V2.  Treated with APAP and naproxen  twice daily.  She was referred to Dr. Keller at [facility] and Dr. Daril at [facility] to see if they may have something to add to help with the face pain. Has not met with either provider yet - heard form one, has not yet returned call.  Not interested in surgical treatment.  Managed with tylenol and alleve BID.       07/11/2023    2:18 PM 07/08/2023   10:54 AM 12/21/2022    1:47 PM 06/25/2022    8:10 AM 11/13/2021    4:03 PM  Depression screen PHQ 2/9  Decreased Interest 0 0 0 0 0  Down, Depressed, Hopeless 1 0 0 0 0  PHQ - 2 Score 1 0 0 0 0  Altered sleeping 0 0 0    Tired, decreased energy 0 0 0    Change in appetite 0 0 0    Feeling bad or failure about yourself  0 0  0    Trouble concentrating 0 0 0    Moving slowly or fidgety/restless 0 0 0    Suicidal thoughts 0 0 0    PHQ-9 Score 1 0 0    Difficult doing work/chores Not difficult at all Not difficult at all Not difficult at all       History Patient Active Problem List   Diagnosis Date Noted   History of malignant melanoma of skin 02/17/2024   History of neoplasm 02/17/2024   Inflamed seborrheic keratosis 02/17/2024   SK (seborrheic keratosis) 02/17/2024   Hemangioma of skin and subcutaneous tissue 02/17/2024   Lentigo 02/17/2024   Malignant melanoma of upper limb (HCC) 02/17/2024   Sebaceous cyst 02/17/2024   Oromandibular dystonia 02/12/2022   Osteopenia 07/31/2017   Essential tremor 10/18/2014   Vasomotor rhinitis    HTN (hypertension)    Hyperlipidemia    Obesity    Myofascial muscle pain    History  of colonic polyps    Past Medical History:  Diagnosis Date   Allergy    Atypical chest pain 07/18/2015   HTN (hypertension)    Hyperlipidemia    Myofacial muscle pain    FACIAL   Obesity    Personal history of colonic polyps 2009   Repeat colonoscopy 2014   Vasomotor rhinitis    Past Surgical History:  Procedure Laterality Date   ABDOMINAL HYSTERECTOMY  1994   PARTIAL-fibroids   CHOLECYSTECTOMY  2002   GANGLION CYST EXCISION  2002   lt hand   No Known Allergies Prior to Admission medications   Medication Sig Start Date End Date Taking? Authorizing Provider  Acetaminophen (TYLENOL) 325 MG CAPS Take 2 capsules by mouth 3 (three) times daily. Or Ibuprofen   Yes [provider]  benazepril  (LOTENSIN ) 10 MG tablet TAKE 1 TABLET(10 MG) BY MOUTH DAILY 04/09/23  Yes Tabori, Katherine E, MD  Multiple Vitamin (MULTIVITAMIN) tablet Take 1 tablet by mouth daily.   Yes [provider]  Naproxen  Sodium (ALEVE ) 220 MG CAPS Take 1 capsule by mouth daily.   Yes [provider]  simvastatin  (ZOCOR ) 10 MG tablet TAKE 1 TABLET BY MOUTH EVERY NIGHT AT BEDTIME 02/14/24  Yes Levora Reyes SAUNDERS, MD   Social History   Socioeconomic History   Marital status: Married    Spouse name: Not on file   Number of children: Not on file   Years of education: Not on file   Highest education level: Professional school degree (e.g., MD, DDS, DVM, JD)  Occupational History   Not on file  Tobacco Use   Smoking status: Never   Smokeless tobacco: Never  Vaping Use   Vaping status: Never Used  Substance and Sexual Activity   Alcohol use: Yes    Alcohol/week: 4.0 standard drinks of alcohol    Types: 4 Standard drinks or equivalent per week    Comment: wine   Drug use: No   Sexual activity: Yes    Birth control/protection: Surgical  Other Topics Concern   Not on file  Social History Narrative   Epworth Sleepiness Scale = 8 (as of 07/18/15)   Social Drivers of Health    Financial Resource Strain: Low Risk  (02/14/2024)   Overall Financial Resource Strain (CARDIA)    Difficulty of Paying Living Expenses: Not hard at all  Food Insecurity: No Food Insecurity (02/14/2024)   Hunger Vital Sign    Worried About Running Out of Food in the Last Year: Never true  Ran Out of Food in the Last Year: Never true  Transportation Needs: No Transportation Needs (02/14/2024)   PRAPARE - Administrator, Civil Service (Medical): No    Lack of Transportation (Non-Medical): No  Physical Activity: Insufficiently Active (02/14/2024)   Exercise Vital Sign    Days of Exercise per Week: 3 days    Minutes of Exercise per Session: 40 min  Stress: Stress Concern Present (02/14/2024)   [employer] of Occupational Health - Occupational Stress Questionnaire    Feeling of Stress: Rather much  Social Connections: Moderately Integrated (02/14/2024)   Social Connection and Isolation Panel    Frequency of Communication with Friends and Family: Three times a week    Frequency of Social Gatherings with Friends and Family: Once a week    Attends Religious Services: Never    Database administrator or Organizations: Yes    Attends Engineer, structural: More than 4 times per year    Marital Status: Married  Catering manager Violence: Not At Risk (07/11/2023)   Humiliation, Afraid, Rape, and Kick questionnaire    Fear of Current or Ex-Partner: No    Emotionally Abused: No    Physically Abused: No    Sexually Abused: No    Review of Systems  Constitutional:  Negative for fatigue and unexpected weight change.  Respiratory:  Negative for chest tightness and shortness of breath.   Cardiovascular:  Negative for chest pain, palpitations and leg swelling.  Gastrointestinal:  Negative for abdominal pain and blood in stool.  Neurological:  Negative for dizziness, syncope, light-headedness and headaches.     Objective:   Vitals:   02/17/24 1340  BP: 122/60  Pulse:  72  Resp: 17  Temp: 97.9 F (36.6 C)  TempSrc: Temporal  SpO2: 96%  Weight: 197 lb 9.6 oz (89.6 kg)  Height: 5' 4 (1.626 m)     Physical Exam Vitals reviewed.  Constitutional:      Appearance: Normal appearance. She is well-developed.  HENT:     Head: Normocephalic and atraumatic.   Eyes:     Conjunctiva/sclera: Conjunctivae normal.     Pupils: Pupils are equal, round, and reactive to light.   Neck:     Vascular: No carotid bruit.   Cardiovascular:     Rate and Rhythm: Normal rate and regular rhythm.     Heart sounds: Normal heart sounds.  Pulmonary:     Effort: Pulmonary effort is normal.     Breath sounds: Normal breath sounds.  Abdominal:     Palpations: Abdomen is soft. There is no pulsatile mass.     Tenderness: There is no abdominal tenderness.   Musculoskeletal:     Right lower leg: No edema.     Left lower leg: No edema.   Skin:    General: Skin is warm and dry.   Neurological:     Mental Status: She is alert and oriented to person, place, and time.   Psychiatric:        Mood and Affect: Mood normal.        Behavior: Behavior normal.        Assessment & Plan:  ALLIS QUIRARTE is a 78 y.o. female . Achilles tendinitis of left lower extremity - Plan: Ambulatory referral to Orthopedic Surgery  Hyperlipidemia, unspecified hyperlipidemia type - Plan: Lipid panel, simvastatin  (ZOCOR ) 10 MG tablet  Hypertension, unspecified type - Plan: Comprehensive metabolic panel with GFR, benazepril  (LOTENSIN ) 10 MG tablet  Chronic face  pain  Situational stress  Prediabetes - Plan: Hemoglobin A1c   Meds ordered this encounter  Medications   benazepril  (LOTENSIN ) 10 MG tablet    Sig: Take 1 tablet (10 mg total) by mouth daily.    Dispense:  90 tablet    Refill:  2   simvastatin  (ZOCOR ) 10 MG tablet    Sig: Take 1 tablet (10 mg total) by mouth at bedtime.    Dispense:  90 tablet    Refill:  2   Patient Instructions  Thanks for coming in today. I am  sorry to hear about the stressors. See info below about managing stress. Exercise is a great way to help manage stress. Please let me know if other resources needed.  It may be reasonable to meet with a specialist was recommended by neurology just to discuss different options for the face pain.  Tylenol for now, Aleve  for now.  If any concerns on labs I will let you know, no med changes at this time.  I will refer you to sports medicine/Ortho to discuss likely chronic Achilles tendon treatments.  Managing Your Hypertension Hypertension, also called high blood pressure, is when the force of the blood pressing against the walls of the arteries is too strong. Arteries are blood vessels that carry blood from your heart throughout your body. Hypertension forces the heart to work harder to pump blood and may cause the arteries to become narrow or stiff. Understanding blood pressure readings A blood pressure reading includes a higher number over a lower number: The first, or top, number is called the systolic pressure. It is a measure of the pressure in your arteries as your heart beats. The second, or bottom number, is called the diastolic pressure. It is a measure of the pressure in your arteries as the heart relaxes. For most people, a normal blood pressure is below 120/80. Your personal target blood pressure may vary depending on your medical conditions, your age, and other factors. Blood pressure is classified into four stages. Based on your blood pressure reading, your health care provider may use the following stages to determine what type of treatment you need, if any. Systolic pressure and diastolic pressure are measured in a unit called millimeters of mercury (mmHg). Normal Systolic pressure: below 120. Diastolic pressure: below 80. Elevated Systolic pressure: 120-129. Diastolic pressure: below 80. Hypertension stage 1 Systolic pressure: 130-139. Diastolic pressure: 80-89. Hypertension  stage 2 Systolic pressure: 140 or above. Diastolic pressure: 90 or above. How can this condition affect me? Managing your hypertension is very important. Over time, hypertension can damage the arteries and decrease blood flow to parts of the body, including the brain, heart, and kidneys. Having untreated or uncontrolled hypertension can lead to: A heart attack. A stroke. A weakened blood vessel (aneurysm). Heart failure. Kidney damage. Eye damage. Memory and concentration problems. Vascular dementia. What actions can I take to manage this condition? Hypertension can be managed by making lifestyle changes and possibly by taking medicines. Your health care provider will help you make a plan to bring your blood pressure within a normal range. You may be referred for counseling on a healthy diet and physical activity. Nutrition  Eat a diet that is high in fiber and potassium, and low in salt (sodium), added sugar, and fat. An example eating plan is called the DASH diet. DASH stands for Dietary Approaches to Stop Hypertension. To eat this way: Eat plenty of fresh fruits and vegetables. Try to fill one-half of your  plate at each meal with fruits and vegetables. Eat whole grains, such as whole-wheat pasta, brown rice, or whole-grain bread. Fill about one-fourth of your plate with whole grains. Eat low-fat dairy products. Avoid fatty cuts of meat, processed or cured meats, and poultry with skin. Fill about one-fourth of your plate with lean proteins such as fish, chicken without skin, beans, eggs, and tofu. Avoid pre-made and processed foods. These tend to be higher in sodium, added sugar, and fat. Reduce your daily sodium intake. Many people with hypertension should eat less than 1,500 mg of sodium a day. Lifestyle  Work with your health care provider to maintain a healthy body weight or to lose weight. Ask what an ideal weight is for you. Get at least 30 minutes of exercise that causes your  heart to beat faster (aerobic exercise) most days of the week. Activities may include walking, swimming, or biking. Include exercise to strengthen your muscles (resistance exercise), such as weight lifting, as part of your weekly exercise routine. Try to do these types of exercises for 30 minutes at least 3 days a week. Do not use any products that contain nicotine or tobacco. These products include cigarettes, chewing tobacco, and vaping devices, such as e-cigarettes. If you need help quitting, ask your health care provider. Control any long-term (chronic) conditions you have, such as high cholesterol or diabetes. Identify your sources of stress and find ways to manage stress. This may include meditation, deep breathing, or making time for fun activities. Alcohol use Do not drink alcohol if: Your health care provider tells you not to drink. You are pregnant, may be pregnant, or are planning to become pregnant. If you drink alcohol: Limit how much you have to: 0-1 drink a day for women. 0-2 drinks a day for men. Know how much alcohol is in your drink. In the [place] one drink equals one 12 oz bottle of beer (355 mL), one 5 oz glass of wine (148 mL), or one 1 oz glass of hard liquor (44 mL). Medicines Your health care provider may prescribe medicine if lifestyle changes are not enough to get your blood pressure under control and if: Your systolic blood pressure is 130 or higher. Your diastolic blood pressure is 80 or higher. Take medicines only as told by your health care provider. Follow the directions carefully. Blood pressure medicines must be taken as told by your health care provider. The medicine does not work as well when you skip doses. Skipping doses also puts you at risk for problems. Monitoring Before you monitor your blood pressure: Do not smoke, drink caffeinated beverages, or exercise within 30 minutes before taking a measurement. Use the bathroom and empty your bladder  (urinate). Sit quietly for at least 5 minutes before taking measurements. Monitor your blood pressure at home as told by your health care provider. To do this: Sit with your back straight and supported. Place your feet flat on the floor. Do not cross your legs. Support your arm on a flat surface, such as a table. Make sure your upper arm is at heart level. Each time you measure, take two or three readings one minute apart and record the results. You may also need to have your blood pressure checked regularly by your health care provider. General information Talk with your health care provider about your diet, exercise habits, and other lifestyle factors that may be contributing to hypertension. Review all the medicines you take with your health care provider because there may be  side effects or interactions. Keep all follow-up visits. Your health care provider can help you create and adjust your plan for managing your high blood pressure. Where to find more information National Heart, Lung, and Blood Institute: PopSteam.is American Heart Association: www.heart.org Contact a health care provider if: You think you are having a reaction to medicines you have taken. You have repeated (recurrent) headaches. You feel dizzy. You have swelling in your ankles. You have trouble with your vision. Get help right away if: You develop a severe headache or confusion. You have unusual weakness or numbness, or you feel faint. You have severe pain in your chest or abdomen. You vomit repeatedly. You have trouble breathing. These symptoms may be an emergency. Get help right away. Call 911. Do not wait to see if the symptoms will go away. Do not drive yourself to the hospital. Summary Hypertension is when the force of blood pumping through your arteries is too strong. If this condition is not controlled, it may put you at risk for serious complications. Your personal target blood pressure may vary  depending on your medical conditions, your age, and other factors. For most people, a normal blood pressure is less than 120/80. Hypertension is managed by lifestyle changes, medicines, or both. Lifestyle changes to help manage hypertension include losing weight, eating a healthy, low-sodium diet, exercising more, stopping smoking, and limiting alcohol. This information is not intended to replace advice given to you by your health care provider. Make sure you discuss any questions you have with your health care provider. Document Revised: 04/20/2021 Document Reviewed: 04/20/2021 Elsevier Patient Education  2024 Elsevier Inc.   Stress, Adult Stress is a normal reaction to life events. Stress is what you feel when life demands more than you are used to, or more than you think you can handle. Some stress can be useful, such as studying for a test or meeting a deadline at work. Stress that occurs too often or for too long can cause problems. Long-lasting stress is called chronic stress. Chronic stress can affect your emotional health and interfere with relationships and normal daily activities. Too much stress can weaken your body's defense system (immune system) and increase your risk for physical illness. If you already have a medical problem, stress can make it worse. What are the causes? All sorts of life events can cause stress. An event that causes stress for one person may not be stressful for someone else. Major life events, whether positive or negative, commonly cause stress. Examples include: Losing a job or starting a new job. Losing a loved one. Moving to a new town or home. Getting married or divorced. Having a baby. Getting injured or sick. Less obvious life events can also cause stress, especially if they occur day after day or in combination with each other. Examples include: Working long hours. Driving in traffic. Caring for children. Being in debt. Being in a difficult  relationship. What are the signs or symptoms? Stress can cause emotional and physical symptoms and can lead to unhealthy behaviors. These include the following: Emotional symptoms Anxiety. This is feeling worried, afraid, on edge, overwhelmed, or out of control. Anger, including irritation or impatience. Depression. This is feeling sad, down, helpless, or guilty. Trouble focusing, remembering, or making decisions. Physical symptoms Aches and pains. These may affect your head, neck, back, stomach, or other areas of your body. Tight muscles or a clenched jaw. Low energy. Trouble sleeping. Unhealthy behaviors Eating to feel better (overeating) or skipping meals.  Working too much or putting off tasks. Smoking, drinking alcohol, or using drugs to feel better. How is this diagnosed? A stress disorder is diagnosed through an assessment by your health care provider. A stress disorder may be diagnosed based on: Your symptoms and any stressful life events. Your medical history. Tests to rule out other causes of your symptoms. Depending on your condition, your health care provider may refer you to a specialist for further evaluation. How is this treated?  Stress management techniques are the recommended treatment for stress. Medicine is not typically recommended for treating stress. Techniques to reduce your reaction to stressful life events include: Identifying stress. Monitor yourself for symptoms of stress and notice what causes stress for you. These skills may help you to avoid or prepare for stressful events. Managing time. Set your priorities, keep a calendar of events, and learn to say no. These actions can help you avoid taking on too much. Techniques for dealing with stress include: Rethinking the problem. Try to think realistically about stressful events rather than ignoring them or overreacting. Try to find the positives in a stressful situation rather than focusing on the  negatives. Exercise. Physical exercise can release both physical and emotional tension. The key is to find a form of exercise that you enjoy and do it regularly. Relaxation techniques. These relax the body and mind. Find one or more that you enjoy and use the techniques regularly. Examples include: Meditation, deep breathing, or progressive relaxation techniques. Yoga or tai chi. Biofeedback, mindfulness techniques, or journaling. Listening to music, being in nature, or taking part in other hobbies. Practicing a healthy lifestyle. Eat a balanced diet, drink plenty of water, limit or avoid caffeine, and get plenty of sleep. Having a strong support network. Spend time with family, friends, or other people you enjoy being around. Express your feelings and talk things over with someone you trust. Counseling or talk therapy with a mental health provider may help if you are having trouble managing stress by yourself. Follow these instructions at home: Lifestyle  Avoid drugs. Do not use any products that contain nicotine or tobacco. These products include cigarettes, chewing tobacco, and vaping devices, such as e-cigarettes. If you need help quitting, ask your health care provider. If you drink alcohol: Limit how much you have to: 0-1 drink a day for women who are not pregnant. 0-2 drinks a day for men. Know how much alcohol is in a drink. In the [place] one drink equals one 12 oz bottle of beer (355 mL), one 5 oz glass of wine (148 mL), or one 1 oz glass of hard liquor (44 mL). Do not use alcohol or drugs to relax. Eat a balanced diet that includes fresh fruits and vegetables, whole grains, lean meats, fish, eggs, beans, and low-fat dairy. Avoid processed foods and foods high in added fat, sugar, and salt. Exercise at least 30 minutes on 5 or more days each week. Get 7-8 hours of sleep each night. General instructions  Practice stress management techniques as told by your health care  provider. Drink enough fluid to keep your urine pale yellow. Take over-the-counter and prescription medicines only as told by your health care provider. Keep all follow-up visits. This is important. Contact a health care provider if: Your symptoms get worse. You have new symptoms. You feel overwhelmed by your problems and can no longer manage them by yourself. Get help right away if: You have thoughts of hurting yourself or others. Get help right awayif you  feel like you may hurt yourself or others, or have thoughts about taking your own life. Go to your nearest emergency room or: Call 911. Call the National Suicide Prevention Lifeline at 669-476-7318 or 988. This is open 24 hours a day. Text the Crisis Text Line at 928-492-0576. Summary Stress is a normal reaction to life events. It can cause problems if it happens too often or for too long. Practicing stress management techniques is the best way to treat stress. Counseling or talk therapy with a mental health provider may help if you are having trouble managing stress by yourself. This information is not intended to replace advice given to you by your health care provider. Make sure you discuss any questions you have with your health care provider. Document Revised: 03/16/2021 Document Reviewed: 03/16/2021 Elsevier Patient Education  2024 Elsevier Inc.     Signed,   Reyes Pines, MD  Primary Care, [facility] 02/17/24 2:21 PM

## 2024-02-17 NOTE — Patient Instructions (Addendum)
 Thanks for coming in today. I am sorry to hear about the stressors. See info below about managing stress. Exercise is a great way to help manage stress. Please let me know if other resources needed.  It may be reasonable to meet with a specialist was recommended by neurology just to discuss different options for the face pain.  Tylenol for now, Aleve  for now.  If any concerns on labs I will let you know, no med changes at this time.  I will refer you to sports medicine/Ortho to discuss likely chronic Achilles tendon treatments.  Managing Your Hypertension Hypertension, also called high blood pressure, is when the force of the blood pressing against the walls of the arteries is too strong. Arteries are blood vessels that carry blood from your heart throughout your body. Hypertension forces the heart to work harder to pump blood and may cause the arteries to become narrow or stiff. Understanding blood pressure readings A blood pressure reading includes a higher number over a lower number: The first, or top, number is called the systolic pressure. It is a measure of the pressure in your arteries as your heart beats. The second, or bottom number, is called the diastolic pressure. It is a measure of the pressure in your arteries as the heart relaxes. For most people, a normal blood pressure is below 120/80. Your personal target blood pressure may vary depending on your medical conditions, your age, and other factors. Blood pressure is classified into four stages. Based on your blood pressure reading, your health care provider may use the following stages to determine what type of treatment you need, if any. Systolic pressure and diastolic pressure are measured in a unit called millimeters of mercury (mmHg). Normal Systolic pressure: below 120. Diastolic pressure: below 80. Elevated Systolic pressure: 120-129. Diastolic pressure: below 80. Hypertension stage 1 Systolic pressure: 130-139. Diastolic  pressure: 80-89. Hypertension stage 2 Systolic pressure: 140 or above. Diastolic pressure: 90 or above. How can this condition affect me? Managing your hypertension is very important. Over time, hypertension can damage the arteries and decrease blood flow to parts of the body, including the brain, heart, and kidneys. Having untreated or uncontrolled hypertension can lead to: A heart attack. A stroke. A weakened blood vessel (aneurysm). Heart failure. Kidney damage. Eye damage. Memory and concentration problems. Vascular dementia. What actions can I take to manage this condition? Hypertension can be managed by making lifestyle changes and possibly by taking medicines. Your health care provider will help you make a plan to bring your blood pressure within a normal range. You may be referred for counseling on a healthy diet and physical activity. Nutrition  Eat a diet that is high in fiber and potassium, and low in salt (sodium), added sugar, and fat. An example eating plan is called the DASH diet. DASH stands for Dietary Approaches to Stop Hypertension. To eat this way: Eat plenty of fresh fruits and vegetables. Try to fill one-half of your plate at each meal with fruits and vegetables. Eat whole grains, such as whole-wheat pasta, brown rice, or whole-grain bread. Fill about one-fourth of your plate with whole grains. Eat low-fat dairy products. Avoid fatty cuts of meat, processed or cured meats, and poultry with skin. Fill about one-fourth of your plate with lean proteins such as fish, chicken without skin, beans, eggs, and tofu. Avoid pre-made and processed foods. These tend to be higher in sodium, added sugar, and fat. Reduce your daily sodium intake. Many people with hypertension should  eat less than 1,500 mg of sodium a day. Lifestyle  Work with your health care provider to maintain a healthy body weight or to lose weight. Ask what an ideal weight is for you. Get at least 30 minutes of  exercise that causes your heart to beat faster (aerobic exercise) most days of the week. Activities may include walking, swimming, or biking. Include exercise to strengthen your muscles (resistance exercise), such as weight lifting, as part of your weekly exercise routine. Try to do these types of exercises for 30 minutes at least 3 days a week. Do not use any products that contain nicotine or tobacco. These products include cigarettes, chewing tobacco, and vaping devices, such as e-cigarettes. If you need help quitting, ask your health care provider. Control any long-term (chronic) conditions you have, such as high cholesterol or diabetes. Identify your sources of stress and find ways to manage stress. This may include meditation, deep breathing, or making time for fun activities. Alcohol use Do not drink alcohol if: Your health care provider tells you not to drink. You are pregnant, may be pregnant, or are planning to become pregnant. If you drink alcohol: Limit how much you have to: 0-1 drink a day for women. 0-2 drinks a day for men. Know how much alcohol is in your drink. In the [place] one drink equals one 12 oz bottle of beer (355 mL), one 5 oz glass of wine (148 mL), or one 1 oz glass of hard liquor (44 mL). Medicines Your health care provider may prescribe medicine if lifestyle changes are not enough to get your blood pressure under control and if: Your systolic blood pressure is 130 or higher. Your diastolic blood pressure is 80 or higher. Take medicines only as told by your health care provider. Follow the directions carefully. Blood pressure medicines must be taken as told by your health care provider. The medicine does not work as well when you skip doses. Skipping doses also puts you at risk for problems. Monitoring Before you monitor your blood pressure: Do not smoke, drink caffeinated beverages, or exercise within 30 minutes before taking a measurement. Use the bathroom and empty  your bladder (urinate). Sit quietly for at least 5 minutes before taking measurements. Monitor your blood pressure at home as told by your health care provider. To do this: Sit with your back straight and supported. Place your feet flat on the floor. Do not cross your legs. Support your arm on a flat surface, such as a table. Make sure your upper arm is at heart level. Each time you measure, take two or three readings one minute apart and record the results. You may also need to have your blood pressure checked regularly by your health care provider. General information Talk with your health care provider about your diet, exercise habits, and other lifestyle factors that may be contributing to hypertension. Review all the medicines you take with your health care provider because there may be side effects or interactions. Keep all follow-up visits. Your health care provider can help you create and adjust your plan for managing your high blood pressure. Where to find more information National Heart, Lung, and Blood Institute: PopSte[employer].is American Heart Association: www.heart.org Contact a health care provider if: You think you are having a reaction to medicines you have taken. You have repeated (recurrent) headaches. You feel dizzy. You have swelling in your ankles. You have trouble with your vision. Get help right away if: You develop a severe headache or  confusion. You have unusual weakness or numbness, or you feel faint. You have severe pain in your chest or abdomen. You vomit repeatedly. You have trouble breathing. These symptoms may be an emergency. Get help right away. Call 911. Do not wait to see if the symptoms will go away. Do not drive yourself to the hospital. Summary Hypertension is when the force of blood pumping through your arteries is too strong. If this condition is not controlled, it may put you at risk for serious complications. Your personal target blood  pressure may vary depending on your medical conditions, your age, and other factors. For most people, a normal blood pressure is less than 120/80. Hypertension is managed by lifestyle changes, medicines, or both. Lifestyle changes to help manage hypertension include losing weight, eating a healthy, low-sodium diet, exercising more, stopping smoking, and limiting alcohol. This information is not intended to replace advice given to you by your health care provider. Make sure you discuss any questions you have with your health care provider. Document Revised: 04/20/2021 Document Reviewed: 04/20/2021 Elsevier Patient Education  2024 Elsevier Inc.   Stress, Adult Stress is a normal reaction to life events. Stress is what you feel when life demands more than you are used to, or more than you think you can handle. Some stress can be useful, such as studying for a test or meeting a deadline at work. Stress that occurs too often or for too long can cause problems. Long-lasting stress is called chronic stress. Chronic stress can affect your emotional health and interfere with relationships and normal daily activities. Too much stress can weaken your body's defense system (immune system) and increase your risk for physical illness. If you already have a medical problem, stress can make it worse. What are the causes? All sorts of life events can cause stress. An event that causes stress for one person may not be stressful for someone else. Major life events, whether positive or negative, commonly cause stress. Examples include: Losing a job or starting a new job. Losing a loved one. Moving to a new town or home. Getting married or divorced. Having a baby. Getting injured or sick. Less obvious life events can also cause stress, especially if they occur day after day or in combination with each other. Examples include: Working long hours. Driving in traffic. Caring for children. Being in debt. Being in a  difficult relationship. What are the signs or symptoms? Stress can cause emotional and physical symptoms and can lead to unhealthy behaviors. These include the following: Emotional symptoms Anxiety. This is feeling worried, afraid, on edge, overwhelmed, or out of control. Anger, including irritation or impatience. Depression. This is feeling sad, down, helpless, or guilty. Trouble focusing, remembering, or making decisions. Physical symptoms Aches and pains. These may affect your head, neck, back, stomach, or other areas of your body. Tight muscles or a clenched jaw. Low energy. Trouble sleeping. Unhealthy behaviors Eating to feel better (overeating) or skipping meals. Working too much or putting off tasks. Smoking, drinking alcohol, or using drugs to feel better. How is this diagnosed? A stress disorder is diagnosed through an assessment by your health care provider. A stress disorder may be diagnosed based on: Your symptoms and any stressful life events. Your medical history. Tests to rule out other causes of your symptoms. Depending on your condition, your health care provider may refer you to a specialist for further evaluation. How is this treated?  Stress management techniques are the recommended treatment for stress.  Medicine is not typically recommended for treating stress. Techniques to reduce your reaction to stressful life events include: Identifying stress. Monitor yourself for symptoms of stress and notice what causes stress for you. These skills may help you to avoid or prepare for stressful events. Managing time. Set your priorities, keep a calendar of events, and learn to say no. These actions can help you avoid taking on too much. Techniques for dealing with stress include: Rethinking the problem. Try to think realistically about stressful events rather than ignoring them or overreacting. Try to find the positives in a stressful situation rather than focusing on the  negatives. Exercise. Physical exercise can release both physical and emotional tension. The key is to find a form of exercise that you enjoy and do it regularly. Relaxation techniques. These relax the body and mind. Find one or more that you enjoy and use the techniques regularly. Examples include: Meditation, deep breathing, or progressive relaxation techniques. Yoga or tai chi. Biofeedback, mindfulness techniques, or journaling. Listening to music, being in nature, or taking part in other hobbies. Practicing a healthy lifestyle. Eat a balanced diet, drink plenty of water, limit or avoid caffeine, and get plenty of sleep. Having a strong support network. Spend time with family, friends, or other people you enjoy being around. Express your feelings and talk things over with someone you trust. Counseling or talk therapy with a mental health provider may help if you are having trouble managing stress by yourself. Follow these instructions at home: Lifestyle  Avoid drugs. Do not use any products that contain nicotine or tobacco. These products include cigarettes, chewing tobacco, and vaping devices, such as e-cigarettes. If you need help quitting, ask your health care provider. If you drink alcohol: Limit how much you have to: 0-1 drink a day for women who are not pregnant. 0-2 drinks a day for men. Know how much alcohol is in a drink. In the [place] one drink equals one 12 oz bottle of beer (355 mL), one 5 oz glass of wine (148 mL), or one 1 oz glass of hard liquor (44 mL). Do not use alcohol or drugs to relax. Eat a balanced diet that includes fresh fruits and vegetables, whole grains, lean meats, fish, eggs, beans, and low-fat dairy. Avoid processed foods and foods high in added fat, sugar, and salt. Exercise at least 30 minutes on 5 or more days each week. Get 7-8 hours of sleep each night. General instructions  Practice stress management techniques as told by your health care  provider. Drink enough fluid to keep your urine pale yellow. Take over-the-counter and prescription medicines only as told by your health care provider. Keep all follow-up visits. This is important. Contact a health care provider if: Your symptoms get worse. You have new symptoms. You feel overwhelmed by your problems and can no longer manage them by yourself. Get help right away if: You have thoughts of hurting yourself or others. Get help right awayif you feel like you may hurt yourself or others, or have thoughts about taking your own life. Go to your nearest emergency room or: Call 911. Call the National Suicide Prevention Lifeline at 478-525-4963 or 988. This is open 24 hours a day. Text the Crisis Text Line at (661) 276-5646. Summary Stress is a normal reaction to life events. It can cause problems if it happens too often or for too long. Practicing stress management techniques is the best way to treat stress. Counseling or talk therapy with a mental health provider  may help if you are having trouble managing stress by yourself. This information is not intended to replace advice given to you by your health care provider. Make sure you discuss any questions you have with your health care provider. Document Revised: 03/16/2021 Document Reviewed: 03/16/2021 Elsevier Patient Education  2024 [employer].

## 2024-02-18 ENCOUNTER — Encounter: Payer: Self-pay | Admitting: Family Medicine

## 2024-02-18 ENCOUNTER — Ambulatory Visit: Payer: Self-pay | Admitting: Family Medicine

## 2024-02-18 LAB — COMPREHENSIVE METABOLIC PANEL WITH GFR
ALT: 24 U/L (ref 0–35)
AST: 25 U/L (ref 0–37)
Albumin: 4.2 g/dL (ref 3.5–5.2)
Alkaline Phosphatase: 50 U/L (ref 39–117)
BUN: 18 mg/dL (ref 6–23)
CO2: 30 meq/L (ref 19–32)
Calcium: 10.4 mg/dL (ref 8.4–10.5)
Chloride: 102 meq/L (ref 96–112)
Creatinine, Ser: 0.69 mg/dL (ref 0.40–1.20)
GFR: 84.05 mL/min (ref >60.00)
Glucose, Bld: 104 mg/dL — ABNORMAL HIGH (ref 70–99)
Potassium: 4.7 meq/L (ref 3.5–5.1)
Sodium: 136 meq/L (ref 135–145)
Total Bilirubin: 0.9 mg/dL (ref 0.2–1.2)
Total Protein: 7.3 g/dL (ref 6.0–8.3)

## 2024-02-18 LAB — LIPID PANEL
Cholesterol: 163 mg/dL (ref 0–200)
HDL: 52 mg/dL (ref >39.00)
LDL Cholesterol: 78 mg/dL (ref 0–99)
NonHDL: 111.04
Total CHOL/HDL Ratio: 3
Triglycerides: 163 mg/dL — ABNORMAL HIGH (ref 0.0–149.0)
VLDL: 32.6 mg/dL (ref 0.0–40.0)

## 2024-03-30 DIAGNOSIS — M7660 Achilles tendinitis, unspecified leg: Secondary | ICD-10-CM | POA: Insufficient documentation

## 2024-04-03 ENCOUNTER — Ambulatory Visit: Payer: Self-pay

## 2024-04-03 ENCOUNTER — Telehealth: Admitting: Family Medicine

## 2024-04-03 DIAGNOSIS — W57XXXA Bitten or stung by nonvenomous insect and other nonvenomous arthropods, initial encounter: Secondary | ICD-10-CM | POA: Diagnosis not present

## 2024-04-03 DIAGNOSIS — L989 Disorder of the skin and subcutaneous tissue, unspecified: Secondary | ICD-10-CM

## 2024-04-03 MED ORDER — DOXYCYCLINE HYCLATE 100 MG PO TABS: 100.0000 mg | ORAL_TABLET | Freq: Two times a day (BID) | ORAL | 0 refills | Status: AC

## 2024-04-03 NOTE — Progress Notes (Signed)
 Virtual Visit Consent   Shelby Flores, you are scheduled for a virtual visit with a [facility] provider today. Just as with appointments in the office, your consent must be obtained to participate. Your consent will be active for this visit and any virtual visit you may have with one of our providers in the next 365 days. If you have a MyChart account, a copy of this consent can be sent to you electronically.  As this is a virtual visit, video technology does not allow for your provider to perform a traditional examination. This may limit your provider's ability to fully assess your condition. If your provider identifies any concerns that need to be evaluated in person or the need to arrange testing (such as labs, EKG, etc.), we will make arrangements to do so. Although advances in technology are sophisticated, we cannot ensure that it will always work on either your end or our end. If the connection with a video visit is poor, the visit may have to be switched to a telephone visit. With either a video or telephone visit, we are not always able to ensure that we have a secure connection.  By engaging in this virtual visit, you consent to the provision of healthcare and authorize for your insurance to be billed (if applicable) for the services provided during this visit. Depending on your insurance coverage, you may receive a charge related to this service.  I need to obtain your verbal consent now. Are you willing to proceed with your visit today? Shelby Flores has provided verbal consent on 04/03/2024 for a virtual visit (video or telephone). Shelby Lamp, FNP  Date: 04/03/2024 3:48 PM   Virtual Visit via Video Note   I, Shelby Flores, connected with  Shelby Flores  ([id number], 06-22-1946) on 04/03/24 at  3:45 PM EDT by a video-enabled telemedicine application and verified that I am speaking with the correct person using two identifiers.  Location: Patient: Virtual Visit Location Patient:  Home Provider: Virtual Visit Location Provider: Home Office   I discussed the limitations of evaluation and management by telemedicine and the availability of in person appointments. The patient expressed understanding and agreed to proceed.    History of Present Illness: Shelby Flores is a 78 y.o. who identifies as a female who was assigned female at birth, and is being seen today for a tick bite to right lower posterior leg. She just removed tick and does not know how long it was on there. Her neck was stiff several days ago but that went away. No fever, aching or headache.   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 Or Ibuprofen, Disp: , Rfl:    benazepril  (LOTENSIN ) 10 MG tablet, Take 1 tablet (10 mg total) by mouth daily., Disp: 90 tablet, Rfl: 2   Multiple Vitamin (MULTIVITAMIN) tablet, Take 1 tablet by mouth daily., Disp: , Rfl:    Naproxen  Sodium (ALEVE ) 220 MG CAPS, Take 1 capsule by mouth daily., Disp: , Rfl:    simvastatin  (ZOCOR ) 10 MG tablet, Take 1 tablet (10 mg total) by mouth at bedtime., Disp: 90 tablet,  Rfl: 2  Observations/Objective: Patient is well-developed, well-nourished in no acute distress.  Resting comfortably  at home.  Head is normocephalic, atraumatic.  No labored breathing.  Speech is clear and coherent with logical content.  Patient is alert and oriented at baseline.    Assessment and Plan: 1. Skin lesion  2. Tick bite, unspecified site, initial encounter (Primary)  Keep wound clean and dry, go in to pcp for alpha gal testing concerns. Go to UC for new sx and testing as needed.   Follow Up Instructions: I discussed the assessment and treatment plan with the patient. The patient was provided an opportunity to ask questions and all were answered. The patient agreed with the plan and demonstrated an understanding of the instructions.  A copy of instructions were sent to the patient via MyChart unless otherwise noted below.     The patient was advised to call back or seek an in-person evaluation if the symptoms worsen or if the condition fails to improve as anticipated.    Shelby Gumz, FNP

## 2024-04-03 NOTE — Telephone Encounter (Signed)
 FYI Only or Action Required?: FYI only for provider.  Patient was last seen in primary care on 02/17/2024 by Levora Reyes SAUNDERS, MD.  Called Nurse Triage reporting Tick Removal.  Symptoms began today.  Interventions attempted: Nothing.  Symptoms are: unchanged.  Triage Disposition: See Physician Within 24 Hours  Patient/caregiver understands and will follow disposition?: Yes       Copied from CRM #[id number]. Topic: Clinical - Red Word Triage >> Apr 03, 2024  3:00 PM Pinkey ORN wrote: Red Word that prompted transfer to Nurse Triage: Tick Bite >> Apr 03, 2024  3:01 PM Pinkey ORN wrote: Patient just had a tick stuck to her and states it left a huge red mark on her right leg. Patient states redness and itchiness.  Reason for Disposition  Red ring or bull's-eye rash occurs at tick bite  Answer Assessment - Initial Assessment Questions 1. ATTACHED:  Is the tick still on the skin?  (e.g., yes, no, unsure)     no 2. ONSET - TICK STILL ATTACHED:  How long do you think the tick has been on your skin? (e.g., hours, days, unsure)  Note:  Is there a recent activity (camping, hiking) where the caller may have been exposed?     no 3. ONSET - TICK NOT STILL ATTACHED: If the tick has been removed, how long do you think the tick was attached before you removed it? (e.g., 5 hours, 2 days). When was this?     unknown 4. LOCATION: Where is the tick bite located? (e.g., arm, leg)     Back of right leg 5. TYPE of TICK: Is it a wood tick or a deer tick? (e.g., deer tick, wood tick; unsure)     unsure 6. SIZE of TICK: How big is the tick? (e.g., size of poppy seed, apple seed, watermelon seed; unsure) Note: Deer ticks can be the size of a poppy seed (nymph) or an apple seed (adult).       Apple seed maybe a little bigger  7. ENGORGED: Did the tick look flat or engorged (full, swollen)? (e.g., flat, engorged; unsure)     Wasn't full  8. OTHER SYMPTOMS: Do you have any other  symptoms? (e.g., fever, rash, redness at bite area, red ring around bite)     Red circle around area about the size of a nickel, itching,  Protocols used: Tick Bite-A-AH

## 2024-04-03 NOTE — Patient Instructions (Signed)
Tick Bite Information, Adult  Ticks are insects that draw blood for food. They climb onto people and animals that brush against the leaves and grasses that they live in. They then bite and attach to the skin. Most ticks are harmless, but some ticks may carry germs that can cause disease. These germs are spread to a person through a bite. To lower your risk of getting a disease from a tick bite, make sure you: Take steps to prevent tick bites. Check for ticks after being outdoors where ticks live. Watch for symptoms of disease if a tick attached to you or if you think a tick bit you. How can I prevent tick bites? Take these steps to help prevent tick bites when you go outdoors in an area where ticks live: Before you go outdoors: Wear long sleeves and long pants to protect your skin from ticks. Wear light-colored clothing so you can see ticks easier. Tuck your pant legs into your socks. Apply insect repellent that has DEET (20% or higher), picaridin, or IR3535 in it to the following areas: Any bare skin. Avoid areas around the eyes and mouth. Edges of clothing, like the top of your boots, the bottom of your pant legs, and your sleeve cuffs. Consider applying an insect repellant that contains permethrin. Follow the instructions on the label. Do not apply permethrin directly to the skin. Instead, apply to the following areas: Clothing and shoes. Outdoor gear and tents. When you are outdoors: Avoid walking through areas with long grass. If you are walking on a trail, stay in the middle of the trail so your skin, hair, and clothing do not touch the bushes. Check for ticks on your clothing, hair, and skin often while you are outdoors. Check again before you go inside. When you go indoors: Check your clothing for ticks. Tumble dry clothes in a dryer on high heat for at least 10 minutes. If clothes are damp, additional time may be needed. If clothes require washing, use hot water. Check your gear and  pets. Shower soon after being outdoors. Check your body for ticks. Do a full body check using a mirror. Be sure to check your scalp, neck, armpits, waist, groin, and joint areas. These are the spots where ticks attach themselves most often. What is the best way to remove a tick?  Remove the tick as soon as possible. Removing it can prevent germs from passing to your body. Do not remove the tick with your bare fingers. Do not try to remove a tick with heat, alcohol, petroleum jelly, or fingernail polish. These things can cause the tick to salivate and regurgitate into your bloodstream, increasing your risk of getting a disease. To remove a tick that is crawling on your skin: Go outside and brush the tick off. Use tape or a lint roller. To remove a tick that is attached to your skin: Wash your hands. If you have gloves, put them on. Use a fine-tipped tweezer, curved forceps, or a tick-removal tool to gently grasp the tick as close to your skin and the tick's head as possible. Gently pull with a steady, upward, and even pressure until the tick lets go. While removing the tick: Take care to keep the tick's head attached to its body. Do not twist or jerk the tick. This can make the tick's head or mouth parts break off and stay in your skin. If this happens, try to remove the mouth parts with tweezers. If you cannot remove them, leave   the area alone and let the skin heal. Do not squeeze or crush the tick's body. This could force disease-carrying fluids from the tick into your body. What should I do after removing a tick? Clean the bite area and your hands with soap and water, rubbing alcohol, or an iodine scrub. If an antiseptic cream or ointment is available, put a small amount on the bite area. Wash and disinfect any tools that you used to remove the tick. How should I dispose of a tick? To dispose of a live tick, use one of these methods: Place it in rubbing alcohol. Place it in a sealed bag  or container, and throw it away. Wrap it tightly in tape, and throw it away. Flush it down the toilet. Where to find more information Centers for Disease Control and Prevention: cdc.gov/ticks [place] Environmental Protection Agency: epa.gov/insect-repellents Contact a health care provider if: You have symptoms of a disease after a tick bite. Symptoms of a tick-borne disease can occur from moments after the tick bites to 30 days after a tick is removed. Symptoms include: Fever or chills. A red rash that makes a circle (bull's-eye rash) in the bite area. Redness and swelling in the bite area. Headache or stiff neck. Muscle, joint, or bone pain. Abnormal tiredness. Numbness in your legs or trouble walking or moving your legs. Tender or swollen lymph glands. Abdominal pain, vomiting, diarrhea, or weight loss. Get help right away if: You are not able to remove a tick. You have muscle weakness or paralysis. Your symptoms get worse or you experience new symptoms. You find an engorged tick on your skin and you are in an area where there is a higher risk of disease from ticks. Summary Ticks may carry germs that can spread to a person through a bite. These germs can cause disease. Wear protective clothing and use insect repellent to prevent tick bites. Follow the instructions on the label. If you find a tick on your body, remove it as soon as possible. If the tick is attached, do not try to remove it with heat, alcohol, petroleum jelly, or fingernail polish. If you have symptoms of a disease after being bitten by a tick, contact a health care provider. This information is not intended to replace advice given to you by your health care provider. Make sure you discuss any questions you have with your health care provider. Document Revised: 11/06/2021 Document Reviewed: 11/06/2021 Elsevier Patient Education  2024 [employer]  

## 2024-05-02 NOTE — Therapy (Signed)
 OUTPATIENT PHYSICAL THERAPY LOWER EXTREMITY EVALUATION   Patient Name: Shelby Flores MRN: [id number] DOB:Sep 28, 1945, 78 y.o., female Today's Date: 05/04/2024  END OF SESSION:  PT End of Session - 05/04/24 1137     Visit Number 1    Number of Visits 18    Date for PT Re-Evaluation 08/19/24    Authorization Type MEDICARE PART A AND B    Progress Note Due on Visit 10    PT Start Time 1100    PT Stop Time 1140    PT Time Calculation (min) 40 min    Activity Tolerance Patient tolerated treatment well    Behavior During Therapy [facility] for tasks assessed/performed          Past Medical History:  Diagnosis Date   Allergy    Atypical chest pain 07/18/2015   HTN (hypertension)    Hyperlipidemia    Myofacial muscle pain    FACIAL   Obesity    Personal history of colonic polyps 2009   Repeat colonoscopy 2014   Vasomotor rhinitis    Past Surgical History:  Procedure Laterality Date   ABDOMINAL HYSTERECTOMY  1994   PARTIAL-fibroids   CHOLECYSTECTOMY  2002   GANGLION CYST EXCISION  2002   lt hand   Patient Active Problem List   Diagnosis Date Noted   History of malignant melanoma of skin 02/17/2024   History of neoplasm 02/17/2024   Inflamed seborrheic keratosis 02/17/2024   SK (seborrheic keratosis) 02/17/2024   Hemangioma of skin and subcutaneous tissue 02/17/2024   Lentigo 02/17/2024   Malignant melanoma of upper limb (HCC) 02/17/2024   Sebaceous cyst 02/17/2024   Oromandibular dystonia 02/12/2022   Osteopenia 07/31/2017   Essential tremor 10/18/2014   Vasomotor rhinitis    HTN (hypertension)    Hyperlipidemia    Obesity    Myofascial muscle pain    History of colonic polyps     PCP: Levora Reyes SAUNDERS, MD  REFERRING PROVIDER: Aniceto Eva Grebe, PA-C  REFERRING DIAG: Achilles tendinitis, unspecified leg [M76.60]   THERAPY DIAG:  Pain in left ankle and joints of left foot  Muscle weakness (generalized)  Difficulty in walking, not elsewhere  classified  Rationale for Evaluation and Treatment: Rehabilitation  ONSET DATE: Ongoing for a long time.   SUBJECTIVE:   SUBJECTIVE STATEMENT: Pt states that she tore a meniscus and had a repair in her L knee ages ago. She has since been having intermittent pain in her L achilles between 0-8 on a pain scale. I have a high pain tolerance, but it feels like an ice pick is going through it. I have a hard time going up/ down stairs, I use a step too gait pattern.    PERTINENT HISTORY: HTN, Obesity,  PAIN:  Are you having pain? Yes: NPRS scale: 0-8  Pain location: L achilles at insertion  Pain description: Stabbing pain Aggravating factors: Nothing I can pin point.  Relieving factors: Rest  PRECAUTIONS: None  RED FLAGS: None   WEIGHT BEARING RESTRICTIONS: No  FALLS:  Has patient fallen in last 6 months? No  LIVING ENVIRONMENT: Lives with: lives with their family Lives in: House/apartment Stairs: Yes: External: 1 steps; on right going up Has following equipment at home: None  OCCUPATION: Retired  PLOF: Independent  PATIENT GOALS: Pt would like to be pain free.   NEXT MD VISIT: 08/24/2024  OBJECTIVE:  Note: Objective measures were completed at Evaluation unless otherwise noted.  DIAGNOSTIC FINDINGS: none  PATIENT SURVEYS:  Lower  Extremity Functional Score: 61 / 80 = 76.3 %  COGNITION: Overall cognitive status: Within functional limits for tasks assessed     SENSATION: WFL  POSTURE: No Significant postural limitations  PALPATION: Minimal tenderness present at distal achilles insertion medial.   LOWER EXTREMITY ROM:  Full functional ROM of bilat LE's.   LOWER EXTREMITY MMT:  MMT Right eval Left eval  Hip flexion 5/5 4/5  Hip extension    Hip abduction    Knee flexion 5/5 5/5  Knee extension 5/5 4/5   (Blank rows = not tested)   FUNCTIONAL TESTS:  5 times sit to stand: 09.06sec  Timed up and go (TUG): Next session.   GAIT: Distance  walked: 55ft  Assistive device utilized: None Level of assistance: Complete Independence Comments: Trendelenburg gait                                                                                                                                 TREATMENT DATE: Creating, reviewing, and completing below HEP - Bridges with cues for full extension on L side . - Sidelying clams with RTB - Supine SLR  - Standing 3 way leg raise.     PATIENT EDUCATION:  Education details: Educated pt on anatomy and physiology of current symptoms, LEFS, diagnosis, prognosis, HEP,  and POC. Person educated: Patient Education method: Explanation, Demonstration, and Handouts Education comprehension: verbalized understanding and returned demonstration  HOME EXERCISE PROGRAM: Access Code: [email or web address] URL: [email or web address] Date: 05/04/2024 Prepared by: Rojean Batten  Exercises - Supine Bridge  - 1-2 x daily - 7 x weekly - 2-3 sets - 10 reps - Clamshell  - 1-2 x daily - 7 x weekly - 2-3 sets - 10 reps - Seated Long Arc Quad  - 1-2 x daily - 7 x weekly - 2-3 sets - 10 reps - Supine Active Straight Leg Raise  - 1-2 x daily - 7 x weekly - 2-3 sets - 10 reps - Standing Hip Abduction with Counter Support  - 1-2 x daily - 7 x weekly - 2-3 sets - 10 reps - Sit to Stand  - 1-2 x daily - 7 x weekly - 2-3 sets - 10 reps   ASSESSMENT:  CLINICAL IMPRESSION: Patient referred to PT for L achilles pain. She has significant weakness on her L LE. She ambulates with a trendelenburg gait pattern. Patient will benefit from skilled PT to address below impairments, limitations and improve overall function.  OBJECTIVE IMPAIRMENTS: decreased activity tolerance, difficulty walking, decreased balance, decreased endurance, decreased mobility, decreased ROM, decreased strength, impaired flexibility, impaired UE/LE use, postural dysfunction, and pain.  ACTIVITY LIMITATIONS: bending, lifting, carry, locomotion, cleaning,  community activity, driving, and or occupation  PERSONAL FACTORS: HTN, Obesity,  trigeminal neuralgia are also affecting patient's functional outcome.  REHAB POTENTIAL: Good  CLINICAL DECISION MAKING: Stable/uncomplicated  EVALUATION COMPLEXITY: Low    GOALS: Short term PT Goals Target date: 05/18/24 Pt will be  I and compliant with HEP. Baseline:  Goal status: New Pt will decrease pain by 25% overall when present  Baseline: Goal status: New  Long term PT goals Target date: 08/19/2024 Pt will improve ROM to [facility] to improve functional mobility Baseline: Goal status: New Pt will improve  hip/knee strength to at least 5-/5 MMT to improve functional strength Baseline: Goal status: New Pt will improve LEFS by at least 9 points  functional to show improved function Baseline: Goal status: New Pt will reduce pain by overall 50% overall with usual activity Baseline: Goal status: New Pt will reduce pain to overall less than 2-3/10 with usual activity and work activity. Baseline: Goal status: New Pt will be able to negotiate stairs with step through gait pattern.  Baseline: Goal status: New  PLAN: PT FREQUENCY: 1-2 times per week   PT DURATION: 6-8 weeks  PLANNED INTERVENTIONS (unless contraindicated): aquatic PT, Canalith repositioning, cryotherapy, Electrical stimulation, Iontophoresis with 4 mg/ml dexamethasome, Moist heat, traction, Ultrasound, gait training, Therapeutic exercise, balance training, neuromuscular re-education, patient/family education, prosthetic training, manual techniques, passive ROM, dry needling, taping, vasopnuematic device, vestibular, spinal manipulations, joint manipulations  PLAN FOR NEXT SESSION: Update/ progress HEP as needed. Continue working on L side strengthening. Perform TUG test    Rojean JONELLE Batten, PT 05/04/2024, 11:38 AM

## 2024-05-04 ENCOUNTER — Ambulatory Visit (HOSPITAL_BASED_OUTPATIENT_CLINIC_OR_DEPARTMENT_OTHER): Attending: Student | Admitting: Physical Therapy

## 2024-05-04 ENCOUNTER — Other Ambulatory Visit: Payer: Self-pay

## 2024-05-04 DIAGNOSIS — M6281 Muscle weakness (generalized): Secondary | ICD-10-CM | POA: Diagnosis present

## 2024-05-04 DIAGNOSIS — M25572 Pain in left ankle and joints of left foot: Secondary | ICD-10-CM | POA: Diagnosis present

## 2024-05-04 DIAGNOSIS — R262 Difficulty in walking, not elsewhere classified: Secondary | ICD-10-CM | POA: Insufficient documentation

## 2024-05-25 ENCOUNTER — Encounter: Payer: Self-pay | Admitting: Family Medicine

## 2024-05-25 ENCOUNTER — Ambulatory Visit (INDEPENDENT_AMBULATORY_CARE_PROVIDER_SITE_OTHER): Admitting: Family Medicine

## 2024-05-25 ENCOUNTER — Ambulatory Visit: Payer: Self-pay

## 2024-05-25 VITALS — BP 124/76 | HR 72 | Temp 98.5°F | Resp 12 | Ht 64.0 in | Wt 198.0 lb

## 2024-05-25 DIAGNOSIS — M25531 Pain in right wrist: Secondary | ICD-10-CM

## 2024-05-25 DIAGNOSIS — M79641 Pain in right hand: Secondary | ICD-10-CM | POA: Diagnosis not present

## 2024-05-25 DIAGNOSIS — R200 Anesthesia of skin: Secondary | ICD-10-CM

## 2024-05-25 DIAGNOSIS — Z23 Encounter for immunization: Secondary | ICD-10-CM | POA: Diagnosis not present

## 2024-05-25 DIAGNOSIS — M25532 Pain in left wrist: Secondary | ICD-10-CM

## 2024-05-25 NOTE — Telephone Encounter (Signed)
 Patient is scheduled to see you for muscle aches and pains following a tick bite. FYI

## 2024-05-25 NOTE — Telephone Encounter (Signed)
 FYI Only or Action Required?: FYI only for provider.  Patient was last seen in primary care on 04/03/2024 by Blair, Diane W, FNP.  Called Nurse Triage reporting joint pain.  Symptoms began several weeks ago.  Interventions attempted: Prescription medications: Completed abx for tick bite.  Symptoms are: gradually worsening.  Triage Disposition: See PCP Within 2 Weeks  Patient/caregiver understands and will follow disposition?: Yes Copied from CRM [id number]. Topic: Clinical - Red Word Triage >> May 25, 2024 10:09 AM Franky GRADE wrote: Red Word that prompted transfer to Nurse Triage: Patient was seen on 04/03/2024 due to a tick bite, she was placed on doxycycline  and told to call in if she ran a fever or experienced joint pain. Patient has not ran a fever but has been experiencing joint pain. Reason for Disposition  [1] MILD pain (e.g., does not interfere with normal activities) AND [2] present > 7 days  Answer Assessment - Initial Assessment Questions 1. ONSET: When did the muscle aches or body pains start?      Several weeks  2. LOCATION: What part of your body is hurting? (e.g., entire body, arms, legs)      Both hands, more pain in left  3. SEVERITY: How bad is the pain? (Scale 1-10; or mild, moderate, severe)     Mild to moderate  4. CAUSE: What do you think is causing the pains?     Valerie if this is related to gardening or if related to a tick bite  5. FEVER: Do you have a fever? If Yes, ask: What is your temperature, how was it measured, and  when did it start?      No  6. OTHER SYMPTOMS: Do you have any other symptoms? (e.g., chest pain, cold or flu symptoms, rash, weakness, weight loss)     No  7. PREGNANCY: Is there any chance you are pregnant? When was your last menstrual period?     No  8. TRAVEL: Have you traveled out of the country in the last month? (e.g., exposures, travel history)     no  Protocols used: Muscle Aches and Body Pain-A-AH

## 2024-05-25 NOTE — Addendum Note (Signed)
 Addended by: Saif Peter A on: 05/25/2024 03:22 PM   Modules accepted: Orders

## 2024-05-25 NOTE — Progress Notes (Signed)
 Subjective:  Patient ID: Shelby Flores, female    DOB: [birth year]  Age: 78 y.o. MRN: [id number]  CC:  Chief Complaint  Patient presents with   Hand Pain    Bilateral hand pain in the joints. Fatigue. Numbness in the middle finger that comes and goes. Pain on either side of the wrists and down the arm. Gotten worse in the last 8 weeks. Had a tick bite and was tx with abx 04/03/24    HPI Shelby Flores presents for   Bilateral hand pain: Presents for acute visit today after telephone call to triage earlier in the day.  She underwent a video visit on 04/03/2024.  Noted to have a tick bite at that time on her right lower posterior leg.  No fever ache or headache at that time.  Unknown time of attachment.  She was treated with doxycycline  100 mg twice daily for 10 days.  Today she presents with bilateral hand pain in her joints, initially left wrist pain, intermittent numbness in middle finger, and episodic pain in volar forearm starting a few weeks after tick bite. Similar sx's in R wrist past 3-4 weeks. Some stiffness in R wrist, stiffness in had, better with movement. No prior similar sx's. No numbness or forearm pain on right. Left hand dominant.  No fever, no rash. No other new joint pain besides hands/wrists.   Has been doming more gardening than usual past month or two - similar times at wrist/hand pain.   Prior middle finger trigger finger, received injection by Dr. Jackye.  On alleve  for chronic jaw pain alleve BID - with tylenol - trying to cut back on tylenol.    History Patient Active Problem List   Diagnosis Date Noted   History of malignant melanoma of skin 02/17/2024   History of neoplasm 02/17/2024   Inflamed seborrheic keratosis 02/17/2024   SK (seborrheic keratosis) 02/17/2024   Hemangioma of skin and subcutaneous tissue 02/17/2024   Lentigo 02/17/2024   Malignant melanoma of upper limb (HCC) 02/17/2024   Sebaceous cyst 02/17/2024   Oromandibular dystonia 02/12/2022    Osteopenia 07/31/2017   Essential tremor 10/18/2014   Vasomotor rhinitis    HTN (hypertension)    Hyperlipidemia    Obesity    Myofascial muscle pain    History of colonic polyps    Past Medical History:  Diagnosis Date   Allergy    Atypical chest pain 07/18/2015   HTN (hypertension)    Hyperlipidemia    Myofacial muscle pain    FACIAL   Obesity    Personal history of colonic polyps 2009   Repeat colonoscopy 2014   Vasomotor rhinitis    Past Surgical History:  Procedure Laterality Date   ABDOMINAL HYSTERECTOMY  1994   PARTIAL-fibroids   CHOLECYSTECTOMY  2002   GANGLION CYST EXCISION  2002   lt hand   No Known Allergies Prior to Admission medications   Medication Sig Start Date End Date Taking? Authorizing Provider  Acetaminophen (TYLENOL) 325 MG CAPS Take 2 capsules by mouth 3 (three) times daily. Or Ibuprofen   Yes [provider]  benazepril  (LOTENSIN ) 10 MG tablet Take 1 tablet (10 mg total) by mouth daily. 02/17/24  Yes Levora Reyes SAUNDERS, MD  Multiple Vitamin (MULTIVITAMIN) tablet Take 1 tablet by mouth daily.   Yes [provider]  Naproxen  Sodium (ALEVE ) 220 MG CAPS Take 1 capsule by mouth daily.   Yes [provider]  simvastatin  (ZOCOR ) 10 MG tablet  Take 1 tablet (10 mg total) by mouth at bedtime. 02/17/24  Yes Levora Reyes SAUNDERS, MD   Social History   Socioeconomic History   Marital status: Married    Spouse name: Not on file   Number of children: Not on file   Years of education: Not on file   Highest education level: Professional school degree (e.g., MD, DDS, DVM, JD)  Occupational History   Not on file  Tobacco Use   Smoking status: Never   Smokeless tobacco: Never  Vaping Use   Vaping status: Never Used  Substance and Sexual Activity   Alcohol use: Yes    Alcohol/week: 4.0 standard drinks of alcohol    Types: 4 Standard drinks or equivalent per week    Comment: wine   Drug use: No   Sexual activity: Yes    Birth  control/protection: Surgical  Other Topics Concern   Not on file  Social History Narrative   Epworth Sleepiness Scale = 8 (as of 07/18/15)   Social Drivers of Health   Financial Resource Strain: Low Risk  (02/14/2024)   Overall Financial Resource Strain (CARDIA)    Difficulty of Paying Living Expenses: Not hard at all  Food Insecurity: No Food Insecurity (02/14/2024)   Hunger Vital Sign    Worried About Running Out of Food in the Last Year: Never true    Ran Out of Food in the Last Year: Never true  Transportation Needs: No Transportation Needs (02/14/2024)   PRAPARE - Administrator, Civil Service (Medical): No    Lack of Transportation (Non-Medical): No  Physical Activity: Insufficiently Active (02/14/2024)   Exercise Vital Sign    Days of Exercise per Week: 3 days    Minutes of Exercise per Session: 40 min  Stress: Stress Concern Present (02/14/2024)   [employer] of Occupational Health - Occupational Stress Questionnaire    Feeling of Stress: Rather much  Social Connections: Moderately Integrated (02/14/2024)   Social Connection and Isolation Panel    Frequency of Communication with Friends and Family: Three times a week    Frequency of Social Gatherings with Friends and Family: Once a week    Attends Religious Services: Never    Database administrator or Organizations: Yes    Attends Engineer, structural: More than 4 times per year    Marital Status: Married  Catering manager Violence: Not At Risk (07/11/2023)   Humiliation, Afraid, Rape, and Kick questionnaire    Fear of Current or Ex-Partner: No    Emotionally Abused: No    Physically Abused: No    Sexually Abused: No    Review of Systems   Objective:   Vitals:   05/25/24 1433  BP: 124/76  Pulse: 72  Resp: 12  Temp: 98.5 F (36.9 C)  TempSrc: Temporal  SpO2: 98%  Weight: 198 lb (89.8 kg)  Height: 5' 4 (1.626 m)     Physical Exam Vitals reviewed.  Constitutional:      General:  She is not in acute distress.    Appearance: Normal appearance. She is well-developed.  HENT:     Head: Normocephalic and atraumatic.  Cardiovascular:     Rate and Rhythm: Normal rate.  Pulmonary:     Effort: Pulmonary effort is normal.  Musculoskeletal:     Comments: Right hand, bony prominence noted at the DIP of second phalanx.  Nontender.  No focal bony tenderness of the right hand, pain-free range of motion of wrist.  Neurovascular intact distally.  Negative Tinel and Phalen on the right.  Left wrist, possible slight soft tissue swelling, slight discomfort over the volar wrist proximally, at area of hamate.  Scaphoid nontender, distal radius and ulna without focal bony tenderness.  Slight discomfort with range of motion of the wrist but intact.  Neurovascular intact distally into the fingertips.  She does have some numbness with Phalen testing but negative Tinel's.  Neurological:     Mental Status: She is alert and oriented to person, place, and time.  Psychiatric:        Mood and Affect: Mood normal.        Assessment & Plan:  SHAYLAN TUTTON is a 78 y.o. female . Left wrist pain - Plan: Lyme Disease Serology w/Reflex, DG Wrist Complete Left  Right wrist pain - Plan: Lyme Disease Serology w/Reflex, DG Wrist Complete Left  Right hand pain - Plan: Lyme Disease Serology w/Reflex, DG Wrist Complete Left  Numbness of finger - Plan: Lyme Disease Serology w/Reflex, DG Wrist Complete Left  Progressive wrist pain left greater than right as above.  Although she did have a tick bite, that was treated with doxycycline , and timing also seems to coincide with increased yard work, activity with her hands.  Worse on left greater than right and she is left-hand dominant.  May also have a component of carpal tunnel syndrome on the left with proximal forearm symptoms and middle phalanx dysesthesias.  - Will check Lyme titer but again unlikely.  Imaging of left wrist ordered.  Continue Aleve , add  back in Tylenol and activity modification with option to follow-up with hand/Ortho for any persistent symptoms, especially carpal tunnel symptoms.  Understanding expressed.  RTC precautions given.   No orders of the defined types were placed in this encounter.  Patient Instructions  Thanks for coming in today.  I will check a Lyme disease blood test but I think that is less likely with your symptoms, and previous treatment with doxycycline .  Increased yard work, use of hands likely is contributing, with some component of arthritis likely present that can get worse with some of that activity, especially with the stiffness in the morning that improves with time.  The numbness into the middle finger and radiating pain into the forearm could be related to carpal tunnel syndrome on the left side.  As that wrist is more affected and some discomfort over the bones, I think an x-ray would be reasonable and have placed that at the [facility] location below.  You do not need an appointment for that x-ray.  Restarting Tylenol may be helpful, gentle range of motion throughout the day, along with cutting back on some of the repetitive activities of the hands may be helpful.  If not improving, I would recommend following up with your hand specialist.  Let me know if there are questions or any new symptoms.  Take care.   [facility] or xray: Walk in 8:30-4:30 during weekdays, no appointment needed [address]  [place], [place] [address]     Signed,   Reyes Pines, MD [facility], [facility] 05/25/24 3:12 PM

## 2024-05-25 NOTE — Patient Instructions (Signed)
 Thanks for coming in today.  I will check a Lyme disease blood test but I think that is less likely with your symptoms, and previous treatment with doxycycline .  Increased yard work, use of hands likely is contributing, with some component of arthritis likely present that can get worse with some of that activity, especially with the stiffness in the morning that improves with time.  The numbness into the middle finger and radiating pain into the forearm could be related to carpal tunnel syndrome on the left side.  As that wrist is more affected and some discomfort over the bones, I think an x-ray would be reasonable and have placed that at the [facility] location below.  You do not need an appointment for that x-ray.  Restarting Tylenol may be helpful, gentle range of motion throughout the day, along with cutting back on some of the repetitive activities of the hands may be helpful.  If not improving, I would recommend following up with your hand specialist.  Let me know if there are questions or any new symptoms.  Take care.   [facility] or xray: Walk in 8:30-4:30 during weekdays, no appointment needed [address]  [place], [place] [address]

## 2024-05-26 ENCOUNTER — Ambulatory Visit: Payer: Self-pay | Admitting: Family Medicine

## 2024-05-26 DIAGNOSIS — M25531 Pain in right wrist: Secondary | ICD-10-CM

## 2024-05-26 DIAGNOSIS — R200 Anesthesia of skin: Secondary | ICD-10-CM

## 2024-05-26 DIAGNOSIS — M25532 Pain in left wrist: Secondary | ICD-10-CM

## 2024-05-26 LAB — LYME DISEASE SEROLOGY W/REFLEX: Lyme Total Antibody EIA: NEGATIVE

## 2024-05-27 ENCOUNTER — Ambulatory Visit (INDEPENDENT_AMBULATORY_CARE_PROVIDER_SITE_OTHER)
Admission: RE | Admit: 2024-05-27 | Discharge: 2024-05-27 | Disposition: A | Discharge status: Home / Self Care | Source: Ambulatory Visit | Attending: Family Medicine | Admitting: Family Medicine

## 2024-05-27 ENCOUNTER — Other Ambulatory Visit: Payer: Self-pay | Admitting: Family Medicine

## 2024-05-27 DIAGNOSIS — M25532 Pain in left wrist: Secondary | ICD-10-CM

## 2024-05-27 DIAGNOSIS — M79641 Pain in right hand: Secondary | ICD-10-CM

## 2024-05-27 DIAGNOSIS — M25531 Pain in right wrist: Secondary | ICD-10-CM

## 2024-05-27 DIAGNOSIS — E785 Hyperlipidemia, unspecified: Secondary | ICD-10-CM

## 2024-05-27 DIAGNOSIS — R200 Anesthesia of skin: Secondary | ICD-10-CM

## 2024-05-30 ENCOUNTER — Ambulatory Visit (HOSPITAL_BASED_OUTPATIENT_CLINIC_OR_DEPARTMENT_OTHER): Admitting: Physical Therapy

## 2024-06-01 ENCOUNTER — Other Ambulatory Visit: Payer: Self-pay | Admitting: Family Medicine

## 2024-06-01 DIAGNOSIS — I10 Essential (primary) hypertension: Secondary | ICD-10-CM

## 2024-06-03 NOTE — Telephone Encounter (Signed)
 Patient is worried about carpel tunnel? Please advise, thank you

## 2024-06-04 ENCOUNTER — Encounter (HOSPITAL_BASED_OUTPATIENT_CLINIC_OR_DEPARTMENT_OTHER): Admitting: Physical Therapy

## 2024-06-09 NOTE — Telephone Encounter (Signed)
 Reply sent

## 2024-06-10 NOTE — Therapy (Unsigned)
 OUTPATIENT PHYSICAL THERAPY LOWER EXTREMITY TREATMENT   Patient Name: Shelby Flores MRN: [id number] DOB:[birth year], 78 y.o., female Today's Date: 06/11/2024  END OF SESSION:  PT End of Session - 06/11/24 1057     Visit Number 2    Number of Visits 18    Date for Recertification  08/19/24    Authorization Type MEDICARE PART A AND B    Progress Note Due on Visit 10    PT Start Time 1058    PT Stop Time 1140    PT Time Calculation (min) 42 min    Activity Tolerance Patient tolerated treatment well    Behavior During Therapy [facility] for tasks assessed/performed           Past Medical History:  Diagnosis Date   Allergy    Atypical chest pain 07/18/2015   HTN (hypertension)    Hyperlipidemia    Myofacial muscle pain    FACIAL   Obesity    Personal history of colonic polyps 2009   Repeat colonoscopy 2014   Vasomotor rhinitis    Past Surgical History:  Procedure Laterality Date   ABDOMINAL HYSTERECTOMY  1994   PARTIAL-fibroids   CHOLECYSTECTOMY  2002   GANGLION CYST EXCISION  2002   lt hand   Patient Active Problem List   Diagnosis Date Noted   History of malignant melanoma of skin 02/17/2024   History of neoplasm 02/17/2024   Inflamed seborrheic keratosis 02/17/2024   SK (seborrheic keratosis) 02/17/2024   Hemangioma of skin and subcutaneous tissue 02/17/2024   Lentigo 02/17/2024   Malignant melanoma of upper limb (HCC) 02/17/2024   Sebaceous cyst 02/17/2024   Oromandibular dystonia 02/12/2022   Osteopenia 07/31/2017   Essential tremor 10/18/2014   Vasomotor rhinitis    HTN (hypertension)    Hyperlipidemia    Obesity    Myofascial muscle pain    History of colonic polyps     PCP: Levora Reyes SAUNDERS, MD  REFERRING PROVIDER: Aniceto Eva Grebe, PA-C  REFERRING DIAG: Achilles tendinitis, unspecified leg [M76.60]   THERAPY DIAG:  Muscle weakness (generalized)  Difficulty in walking, not elsewhere classified  Pain in left ankle and joints of left  foot  Rationale for Evaluation and Treatment: Rehabilitation  ONSET DATE: Ongoing for a long time.   SUBJECTIVE:   SUBJECTIVE STATEMENT: Pt states that she has been very busy. She has intermittent pain, sometimes it feels like I have an ice pick going through it.   PERTINENT HISTORY: HTN, Obesity,  PAIN:  Are you having pain? Yes: NPRS scale: 0-8  Pain location: L achilles at insertion  Pain description: Stabbing pain Aggravating factors: Nothing I can pin point.  Relieving factors: Rest  PRECAUTIONS: None  RED FLAGS: None   WEIGHT BEARING RESTRICTIONS: No  FALLS:  Has patient fallen in last 6 months? No  LIVING ENVIRONMENT: Lives with: lives with their family Lives in: House/apartment Stairs: Yes: External: 1 steps; on right going up Has following equipment at home: None  OCCUPATION: Retired  PLOF: Independent  PATIENT GOALS: Pt would like to be pain free.   NEXT MD VISIT: 08/24/2024  OBJECTIVE:  Note: Objective measures were completed at Evaluation unless otherwise noted.  DIAGNOSTIC FINDINGS: none  PATIENT SURVEYS:  Lower Extremity Functional Score: 61 / 80 = 76.3 %  COGNITION: Overall cognitive status: Within functional limits for tasks assessed     SENSATION: WFL  POSTURE: No Significant postural limitations  PALPATION: Minimal tenderness present at distal achilles insertion medial.  LOWER EXTREMITY ROM:  Full functional ROM of bilat LE's.   LOWER EXTREMITY MMT:  MMT Right eval Left eval  Hip flexion 5/5 4/5  Hip extension    Hip abduction    Knee flexion 5/5 5/5  Knee extension 5/5 4/5   (Blank rows = not tested)   FUNCTIONAL TESTS:  5 times sit to stand: 09.06sec  Timed up and go (TUG): Next session.   GAIT: Distance walked: 39ft  Assistive device utilized: None Level of assistance: Complete Independence Comments: Trendelenburg gait                                                                                                                                  TREATMENT DATE:  06/11/24 [place] with cues for full extension on L side . - Supine SLR  - Sidelying clams with RTB - Supine marches with RTB - Supine ball squeezes  - Heel raises - Standing 3 way leg raise - Step ups onto 4 step   Creating, reviewing, and completing below HEP - Bridges with cues for full extension on L side . - Sidelying clams with RTB - Supine SLR  - Standing 3 way leg raise.     PATIENT EDUCATION:  Education details: Educated pt on anatomy and physiology of current symptoms, LEFS, diagnosis, prognosis, HEP,  and POC. Person educated: Patient Education method: Explanation, Demonstration, and Handouts Education comprehension: verbalized understanding and returned demonstration  HOME EXERCISE PROGRAM: Access Code: [email or web address] URL: [email or web address] Date: 05/04/2024 Prepared by: Rojean Batten  Exercises - Supine Bridge  - 1-2 x daily - 7 x weekly - 2-3 sets - 10 reps - Clamshell  - 1-2 x daily - 7 x weekly - 2-3 sets - 10 reps - Seated Long Arc Quad  - 1-2 x daily - 7 x weekly - 2-3 sets - 10 reps - Supine Active Straight Leg Raise  - 1-2 x daily - 7 x weekly - 2-3 sets - 10 reps - Standing Hip Abduction with Counter Support  - 1-2 x daily - 7 x weekly - 2-3 sets - 10 reps - Sit to Stand  - 1-2 x daily - 7 x weekly - 2-3 sets - 10 reps   ASSESSMENT:  CLINICAL IMPRESSION:  Pt tolerated session well today with no complaints of pain. She continues to demonstrate decreased hip strength on the L compared to the R. Cues required for bridges. She also fatigues faster with SL 3 way hips on L side resulting in rest breaks and cues for proper form. Pt denies any pain today. She will continue to benefit from skilled PT to address continued deficits.    OBJECTIVE IMPAIRMENTS: decreased activity tolerance, difficulty walking, decreased balance, decreased endurance, decreased mobility, decreased ROM, decreased  strength, impaired flexibility, impaired UE/LE use, postural dysfunction, and pain.  ACTIVITY LIMITATIONS: bending, lifting, carry, locomotion, cleaning, community activity, driving, and or occupation  PERSONAL FACTORS: HTN, Obesity,  trigeminal neuralgia are also affecting patient's functional outcome.  REHAB POTENTIAL: Good  CLINICAL DECISION MAKING: Stable/uncomplicated  EVALUATION COMPLEXITY: Low    GOALS: Short term PT Goals Target date: 05/18/24 Pt will be I and compliant with HEP. Baseline:  Goal status: New Pt will decrease pain by 25% overall when present  Baseline: Goal status: New  Long term PT goals Target date: 08/19/2024 Pt will improve ROM to [facility] to improve functional mobility Baseline: Goal status: New Pt will improve  hip/knee strength to at least 5-/5 MMT to improve functional strength Baseline: Goal status: New Pt will improve LEFS by at least 9 points  functional to show improved function Baseline: Goal status: New Pt will reduce pain by overall 50% overall with usual activity Baseline: Goal status: New Pt will reduce pain to overall less than 2-3/10 with usual activity and work activity. Baseline: Goal status: New Pt will be able to negotiate stairs with step through gait pattern.  Baseline: Goal status: New  PLAN: PT FREQUENCY: 1-2 times per week   PT DURATION: 6-8 weeks  PLANNED INTERVENTIONS (unless contraindicated): aquatic PT, Canalith repositioning, cryotherapy, Electrical stimulation, Iontophoresis with 4 mg/ml dexamethasome, Moist heat, traction, Ultrasound, gait training, Therapeutic exercise, balance training, neuromuscular re-education, patient/family education, prosthetic training, manual techniques, passive ROM, dry needling, taping, vasopnuematic device, vestibular, spinal manipulations, joint manipulations  PLAN FOR NEXT SESSION: Update/ progress HEP as needed. Continue working on L side strengthening. Perform TUG  test    Rojean JONELLE Batten, PT 06/11/2024, 1:02 PM

## 2024-06-11 ENCOUNTER — Ambulatory Visit (HOSPITAL_BASED_OUTPATIENT_CLINIC_OR_DEPARTMENT_OTHER): Attending: Student | Admitting: Physical Therapy

## 2024-06-11 ENCOUNTER — Encounter (HOSPITAL_BASED_OUTPATIENT_CLINIC_OR_DEPARTMENT_OTHER): Payer: Self-pay | Admitting: Physical Therapy

## 2024-06-11 DIAGNOSIS — R262 Difficulty in walking, not elsewhere classified: Secondary | ICD-10-CM | POA: Diagnosis present

## 2024-06-11 DIAGNOSIS — M25572 Pain in left ankle and joints of left foot: Secondary | ICD-10-CM | POA: Diagnosis present

## 2024-06-11 DIAGNOSIS — M6281 Muscle weakness (generalized): Secondary | ICD-10-CM | POA: Insufficient documentation

## 2024-06-12 ENCOUNTER — Ambulatory Visit (INDEPENDENT_AMBULATORY_CARE_PROVIDER_SITE_OTHER): Admitting: Family Medicine

## 2024-06-12 ENCOUNTER — Encounter: Payer: Self-pay | Admitting: Family Medicine

## 2024-06-12 VITALS — BP 120/64 | HR 65 | Temp 98.1°F | Resp 18 | Ht 64.0 in | Wt 200.2 lb

## 2024-06-12 DIAGNOSIS — R0609 Other forms of dyspnea: Secondary | ICD-10-CM | POA: Diagnosis not present

## 2024-06-12 DIAGNOSIS — R9431 Abnormal electrocardiogram [ECG] [EKG]: Secondary | ICD-10-CM | POA: Diagnosis not present

## 2024-06-12 DIAGNOSIS — R0789 Other chest pain: Secondary | ICD-10-CM | POA: Diagnosis not present

## 2024-06-12 DIAGNOSIS — R5383 Other fatigue: Secondary | ICD-10-CM

## 2024-06-12 NOTE — Progress Notes (Signed)
 Subjective:  Patient ID: Shelby Flores, female    DOB: 07/11/1946  Age: 78 y.o. MRN: [id number]  CC:  Chief Complaint  Patient presents with   Abnormal ECG    Went to [facility]. Patient brought a copy of an EKG. She would like to be put on weightloss medications.    Wrist Pain    Left ring and middle finger are now numb to the wrist. She still has not heard from [facility]. She said that she will contact them today.     HPI Shelby Flores presents for   Concerns as above  Abnormal EKG See telephone note.  She was evaluated at [facility] weight loss center with labwork and EKG, part of their evaluation including EKG that shows some abnormalities.  She denies any chest pain or dyspnea with exertion.  Copy of EKG was brought to the office today.   Cardiology eval in 2016 with last EKG in 2018.  Stress testing was reported as low risk study, normal in 2016.  No chest pain. Slight pressure in chest at times with stress, not exercise. No radiation or associated symptoms. 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 Had been going to [place] well past weeks, not in past few weeks. Out of breath at end of PT yesterday. No current CP or dyspnea. Would like to meet with Dr. Francyne if seeing cardiology.   Printed EKG dated 06/06/2024, sinus rhythm,  PR 146 QTc 411.  Heart rate 61.  Printed interpretation indicated sinus rhythm, horizontal axis, anterior septal infarct with slight right precordial repolarization disturbance secondary to infarct, abnormal EKG.  When compared to her September 18, 2016 EKG, slight different QRS and lead III, with nonspecific T wave in lead III, flattened T waves noted in V2, V3 in 2018 as well, inverted T waves on most recent EKG.  No other apparent significant changes.  Wrist pain, dysesthesias Progressive wrist pain discussed at her October 61 visit.  Negative Lyme serologies.  Thought to be possible overuse syndrome and carpal tunnel syndrome  with increased hand activity, yard work.  Symptomatic care discussed, activity modification, and given persistent symptoms she was referred to hand specialist last week, Dr. Camella requested. Prior patient for trigger finger.  Has not yet heard from ortho -plans to call today    History Patient Active Problem List   Diagnosis Date Noted   Insertional Achilles tendinopathy 03/30/2024   History of malignant melanoma of skin 02/17/2024   History of neoplasm 02/17/2024   Inflamed seborrheic keratosis 02/17/2024   SK (seborrheic keratosis) 02/17/2024   Hemangioma of skin and subcutaneous tissue 02/17/2024   Lentigo 02/17/2024   Malignant melanoma of upper limb (HCC) 02/17/2024   Sebaceous cyst 02/17/2024   Acquired trigger finger of left middle finger 12/03/2023   Pain in finger of left hand 12/03/2023   Oromandibular dystonia 02/12/2022   Osteopenia 07/31/2017   Essential tremor 10/18/2014   Vasomotor rhinitis    HTN (hypertension)    Hyperlipidemia    Obesity    Myofascial muscle pain    History of colonic polyps    Past Medical History:  Diagnosis Date   Allergy    Atypical chest pain 07/18/2015   HTN (hypertension)    Hyperlipidemia    Myofacial muscle pain    FACIAL   Obesity    Personal history of colonic polyps 2009   Repeat colonoscopy 2014   Vasomotor rhinitis  Past Surgical History:  Procedure Laterality Date   ABDOMINAL HYSTERECTOMY  1994   PARTIAL-fibroids   CHOLECYSTECTOMY  2002   GANGLION CYST EXCISION  2002   lt hand   No Known Allergies Prior to Admission medications   Medication Sig Start Date End Date Taking? Authorizing Provider  Acetaminophen (TYLENOL) 325 MG CAPS Take 2 capsules by mouth 3 (three) times daily. Or Ibuprofen Patient taking differently: Take 1 capsule by mouth 2 (two) times daily. 1-2 times daily   Yes [provider]  benazepril  (LOTENSIN ) 10 MG tablet Take 1 tablet (10 mg total) by mouth daily. 02/17/24  Yes Levora Reyes SAUNDERS, MD  Multiple Vitamin (MULTIVITAMIN) tablet Take 1 tablet by mouth daily.   Yes [provider]  Multiple Vitamins-Minerals (EYE HEALTH AREDS 2 PO) Take 1 capsule by mouth 2 (two) times daily.   Yes [provider]  Naproxen  Sodium (ALEVE ) 220 MG CAPS Take 1 capsule by mouth daily. Patient taking differently: Take 1 capsule by mouth 2 (two) times daily. 1-2 times a day   Yes [provider]  simvastatin  (ZOCOR ) 10 MG tablet Take 1 tablet (10 mg total) by mouth at bedtime. 02/17/24  Yes Levora Reyes SAUNDERS, MD   Social History   Socioeconomic History   Marital status: Married    Spouse name: Not on file   Number of children: Not on file   Years of education: Not on file   Highest education level: Professional school degree (e.g., MD, DDS, DVM, JD)  Occupational History   Not on file  Tobacco Use   Smoking status: Never   Smokeless tobacco: Never  Vaping Use   Vaping status: Never Used  Substance and Sexual Activity   Alcohol use: Yes    Alcohol/week: 4.0 standard drinks of alcohol    Types: 4 Standard drinks or equivalent per week    Comment: wine   Drug use: No   Sexual activity: Yes    Birth control/protection: Surgical  Other Topics Concern   Not on file  Social History Narrative   Epworth Sleepiness Scale = 8 (as of 07/18/15)   Social Drivers of Health   Financial Resource Strain: Low Risk  (02/14/2024)   Overall Financial Resource Strain (CARDIA)    Difficulty of Paying Living Expenses: Not hard at all  Food Insecurity: No Food Insecurity (02/14/2024)   Hunger Vital Sign    Worried About Running Out of Food in the Last Year: Never true    Ran Out of Food in the Last Year: Never true  Transportation Needs: No Transportation Needs (02/14/2024)   PRAPARE - Administrator, Civil Service (Medical): No    Lack of Transportation (Non-Medical): No  Physical Activity: Insufficiently Active (02/14/2024)   Exercise Vital Sign    Days  of Exercise per Week: 3 days    Minutes of Exercise per Session: 40 min  Stress: Stress Concern Present (02/14/2024)   [employer] of Occupational Health - Occupational Stress Questionnaire    Feeling of Stress: Rather much  Social Connections: Moderately Integrated (02/14/2024)   Social Connection and Isolation Panel    Frequency of Communication with Friends and Family: Three times a week    Frequency of Social Gatherings with Friends and Family: Once a week    Attends Religious Services: Never    Database administrator or Organizations: Yes    Attends Engineer, structural: More than 4 times per year    Marital  Status: Married  Catering manager Violence: Not At Risk (07/11/2023)   Humiliation, Afraid, Rape, and Kick questionnaire    Fear of Current or Ex-Partner: No    Emotionally Abused: No    Physically Abused: No    Sexually Abused: No    Review of Systems Per HPI.   Objective:   Vitals:   06/12/24 1102  BP: 120/64  Pulse: 65  Resp: 18  Temp: 98.1 F (36.7 C)  TempSrc: Temporal  SpO2: 100%  Weight: 200 lb 3.2 oz (90.8 kg)  Height: 5' 4 (1.626 m)     Physical Exam Vitals reviewed.  Constitutional:      Appearance: Normal appearance. She is well-developed.  HENT:     Head: Normocephalic and atraumatic.  Eyes:     Conjunctiva/sclera: Conjunctivae normal.     Pupils: Pupils are equal, round, and reactive to light.  Neck:     Vascular: No carotid bruit.  Cardiovascular:     Rate and Rhythm: Normal rate and regular rhythm.     Heart sounds: Normal heart sounds.  Pulmonary:     Effort: Pulmonary effort is normal.     Breath sounds: Normal breath sounds.  Abdominal:     Palpations: Abdomen is soft. There is no pulsatile mass.     Tenderness: There is no abdominal tenderness.  Musculoskeletal:     Right lower leg: No edema.     Left lower leg: No edema.  Skin:    General: Skin is warm and dry.  Neurological:     Mental Status: She is alert  and oriented to person, place, and time.  Psychiatric:        Mood and Affect: Mood normal.        Behavior: Behavior normal.    EKG: Repeat EKG performed in office today with ventricular rate 59, PR 148, QTc 415.  Computer notes septal infarct age undetermined.Compared to her September 18, 2016 EKG.  Similar appearing QRS, T waves and lead III, also appears similar V1 through V6.  No apparent significant changes or acute ST/T wave changes.    Assessment & Plan:  Shelby Flores is a 78 y.o. female . Abnormal EKG - Plan: Ambulatory referral to Cardiology, EKG 12-Lead Chest pressure - Plan: Ambulatory referral to Cardiology, EKG 12-Lead DOE (dyspnea on exertion) - Plan: Ambulatory referral to Cardiology, EKG 12-Lead Fatigue, unspecified type - Plan: Ambulatory referral to Cardiology, EKG 12-Lead  - Abnormal EKG at outside office, repeat EKG in office today appears to be similar to her EKG in 2018.  However she does note some episodic chest pressure that is more associated with stress to improve activity, atypical symptoms.  Some fatigue and dyspnea with recent physical therapy but may have a component of deconditioning as she has been exercising less recently, previously exercised through stage well without difficulty.  Given the symptoms I think it would be reasonable to follow-up with cardiology and decide if additional testing indicated.  Referral placed.  Avoid exertional exercise for now, low intensity is okay as long as she is asymptomatic with RTC/ER precautions given.  No orders of the defined types were placed in this encounter.  Patient Instructions  Repeat EKG in the office today does look similar to your previous one in 2018.  However with the intermittent chest discomfort/pressure, fatigue, and shortness of breath which could be related to deconditioning, I do think it would be beneficial to meet with cardiology and decide if updated testing or additional testing  is needed.  Avoid  exertional exercise for now.  If any new or worsening symptoms including any chest discomfort or dyspnea with activity I do recommend being seen sooner.  Let me know if you do not have any success with scheduling appointment with Dr. Camella and we can see what we can do.  Thank you for coming in today.    Signed,   Reyes Pines, MD [facility], [facility] 06/12/24 12:25 PM

## 2024-06-12 NOTE — Patient Instructions (Signed)
 Repeat EKG in the office today does look similar to your previous one in 2018.  However with the intermittent chest discomfort/pressure, fatigue, and shortness of breath which could be related to deconditioning, I do think it would be beneficial to meet with cardiology and decide if updated testing or additional testing is needed.  Avoid exertional exercise for now.  If any new or worsening symptoms including any chest discomfort or dyspnea with activity I do recommend being seen sooner.  Let me know if you do not have any success with scheduling appointment with Dr. Camella and we can see what we can do.  Thank you for coming in today.

## 2024-06-18 ENCOUNTER — Ambulatory Visit (HOSPITAL_BASED_OUTPATIENT_CLINIC_OR_DEPARTMENT_OTHER): Admitting: Physical Therapy

## 2024-06-18 ENCOUNTER — Encounter (HOSPITAL_BASED_OUTPATIENT_CLINIC_OR_DEPARTMENT_OTHER): Payer: Self-pay | Admitting: Physical Therapy

## 2024-06-18 DIAGNOSIS — R262 Difficulty in walking, not elsewhere classified: Secondary | ICD-10-CM

## 2024-06-18 DIAGNOSIS — M6281 Muscle weakness (generalized): Secondary | ICD-10-CM | POA: Diagnosis not present

## 2024-06-18 DIAGNOSIS — M25572 Pain in left ankle and joints of left foot: Secondary | ICD-10-CM

## 2024-06-18 NOTE — Therapy (Signed)
 OUTPATIENT PHYSICAL THERAPY LOWER EXTREMITY TREATMENT   Patient Name: Shelby Flores MRN: [id number] DOB:[birth year], 78 y.o., female Today's Date: 06/18/2024  END OF SESSION:  PT End of Session - 06/18/24 1145     Visit Number 3    Number of Visits 18    Date for Recertification  08/19/24    Authorization Type MEDICARE PART A AND B    Progress Note Due on Visit 10    PT Start Time 1103    PT Stop Time 1143    PT Time Calculation (min) 40 min    Activity Tolerance Patient tolerated treatment well    Behavior During Therapy [facility] for tasks assessed/performed            Past Medical History:  Diagnosis Date   Allergy    Atypical chest pain 07/18/2015   HTN (hypertension)    Hyperlipidemia    Myofacial muscle pain    FACIAL   Obesity    Personal history of colonic polyps 2009   Repeat colonoscopy 2014   Vasomotor rhinitis    Past Surgical History:  Procedure Laterality Date   ABDOMINAL HYSTERECTOMY  1994   PARTIAL-fibroids   CHOLECYSTECTOMY  2002   GANGLION CYST EXCISION  2002   lt hand   Patient Active Problem List   Diagnosis Date Noted   Insertional Achilles tendinopathy 03/30/2024   History of malignant melanoma of skin 02/17/2024   History of neoplasm 02/17/2024   Inflamed seborrheic keratosis 02/17/2024   SK (seborrheic keratosis) 02/17/2024   Hemangioma of skin and subcutaneous tissue 02/17/2024   Lentigo 02/17/2024   Malignant melanoma of upper limb (HCC) 02/17/2024   Sebaceous cyst 02/17/2024   Acquired trigger finger of left middle finger 12/03/2023   Pain in finger of left hand 12/03/2023   Oromandibular dystonia 02/12/2022   Osteopenia 07/31/2017   Essential tremor 10/18/2014   Vasomotor rhinitis    HTN (hypertension)    Hyperlipidemia    Obesity    Myofascial muscle pain    History of colonic polyps     PCP: Levora Reyes SAUNDERS, MD  REFERRING PROVIDER: Aniceto Eva Grebe, PA-C  REFERRING DIAG: Achilles tendinitis, unspecified leg  [M76.60]   THERAPY DIAG:  Muscle weakness (generalized)  Difficulty in walking, not elsewhere classified  Pain in left ankle and joints of left foot  Rationale for Evaluation and Treatment: Rehabilitation  ONSET DATE: Ongoing for a long time.   SUBJECTIVE:   SUBJECTIVE STATEMENT: It feels like a nail going through my achilles insertion.   PERTINENT HISTORY: HTN, Obesity,  PAIN:  Are you having pain? Yes: NPRS scale: 0-8  Pain location: L achilles at insertion  Pain description: Stabbing pain Aggravating factors: Nothing I can pin point.  Relieving factors: Rest  PRECAUTIONS: None  RED FLAGS: None   WEIGHT BEARING RESTRICTIONS: No  FALLS:  Has patient fallen in last 6 months? No  LIVING ENVIRONMENT: Lives with: lives with their family Lives in: House/apartment Stairs: Yes: External: 1 steps; on right going up Has following equipment at home: None  OCCUPATION: Retired  PLOF: Independent  PATIENT GOALS: Pt would like to be pain free.   NEXT MD VISIT: 08/24/2024  OBJECTIVE:  Note: Objective measures were completed at Evaluation unless otherwise noted.  DIAGNOSTIC FINDINGS: none  PATIENT SURVEYS:  Lower Extremity Functional Score: 61 / 80 = 76.3 %  COGNITION: Overall cognitive status: Within functional limits for tasks assessed     SENSATION: WFL  POSTURE: No Significant postural  limitations  PALPATION: Minimal tenderness present at distal achilles insertion medial.   LOWER EXTREMITY ROM:  Full functional ROM of bilat LE's.   LOWER EXTREMITY MMT:  MMT Right eval Left eval  Hip flexion 5/5 4/5  Hip extension    Hip abduction    Knee flexion 5/5 5/5  Knee extension 5/5 4/5   (Blank rows = not tested)   FUNCTIONAL TESTS:  5 times sit to stand: 09.06sec  Timed up and go (TUG): Next session.   GAIT: Distance walked: 61ft  Assistive device utilized: None Level of assistance: Complete Independence Comments: Trendelenburg gait                                                                                                                                  TREATMENT DATE:  06/18/24 - IASTM to achilles tendon.  - STM to calf - Calf stretch on board - Heel raises on 2 step  - Step up fwd/ lateral to 2 step   06/11/24 - Bridges with cues for full extension on L side . - Supine SLR  - Sidelying clams with RTB - Supine marches with RTB - Supine ball squeezes  - Heel raises - Standing 3 way leg raise - Step ups onto 4 step   Creating, reviewing, and completing below HEP - Bridges with cues for full extension on L side . - Sidelying clams with RTB - Supine SLR  - Standing 3 way leg raise.     PATIENT EDUCATION:  Education details: Educated pt on anatomy and physiology of current symptoms, LEFS, diagnosis, prognosis, HEP,  and POC. Person educated: Patient Education method: Explanation, Demonstration, and Handouts Education comprehension: verbalized understanding and returned demonstration  HOME EXERCISE PROGRAM: Access Code: [email or web address] URL: [email or web address] Date: 05/04/2024 Prepared by: Rojean Batten  Exercises - Supine Bridge  - 1-2 x daily - 7 x weekly - 2-3 sets - 10 reps - Clamshell  - 1-2 x daily - 7 x weekly - 2-3 sets - 10 reps - Seated Long Arc Quad  - 1-2 x daily - 7 x weekly - 2-3 sets - 10 reps - Supine Active Straight Leg Raise  - 1-2 x daily - 7 x weekly - 2-3 sets - 10 reps - Standing Hip Abduction with Counter Support  - 1-2 x daily - 7 x weekly - 2-3 sets - 10 reps - Sit to Stand  - 1-2 x daily - 7 x weekly - 2-3 sets - 10 reps   ASSESSMENT:  CLINICAL IMPRESSION:  Session with focus on scar tissue mobilization at achilles insertion and STM to calf. She continues to demonstrate decreased hip strength on the L compared to the R. Pt reports a decrease in ankle pain and increased mobility with walking. Discussed not walking with ER. She will continue to benefit from skilled  PT to address continued deficits.    OBJECTIVE IMPAIRMENTS: decreased activity tolerance, difficulty walking, decreased  balance, decreased endurance, decreased mobility, decreased ROM, decreased strength, impaired flexibility, impaired UE/LE use, postural dysfunction, and pain.  ACTIVITY LIMITATIONS: bending, lifting, carry, locomotion, cleaning, community activity, driving, and or occupation  PERSONAL FACTORS: HTN, Obesity,  trigeminal neuralgia are also affecting patient's functional outcome.  REHAB POTENTIAL: Good  CLINICAL DECISION MAKING: Stable/uncomplicated  EVALUATION COMPLEXITY: Low    GOALS: Short term PT Goals Target date: 05/18/24 Pt will be I and compliant with HEP. Baseline:  Goal status: New Pt will decrease pain by 25% overall when present  Baseline: Goal status: New  Long term PT goals Target date: 08/19/2024 Pt will improve ROM to [facility] to improve functional mobility Baseline: Goal status: New Pt will improve  hip/knee strength to at least 5-/5 MMT to improve functional strength Baseline: Goal status: New Pt will improve LEFS by at least 9 points  functional to show improved function Baseline: Goal status: New Pt will reduce pain by overall 50% overall with usual activity Baseline: Goal status: New Pt will reduce pain to overall less than 2-3/10 with usual activity and work activity. Baseline: Goal status: New Pt will be able to negotiate stairs with step through gait pattern.  Baseline: Goal status: New  PLAN: PT FREQUENCY: 1-2 times per week   PT DURATION: 6-8 weeks  PLANNED INTERVENTIONS (unless contraindicated): aquatic PT, Canalith repositioning, cryotherapy, Electrical stimulation, Iontophoresis with 4 mg/ml dexamethasome, Moist heat, traction, Ultrasound, gait training, Therapeutic exercise, balance training, neuromuscular re-education, patient/family education, prosthetic training, manual techniques, passive ROM, dry needling, taping,  vasopnuematic device, vestibular, spinal manipulations, joint manipulations  PLAN FOR NEXT SESSION: Update/ progress HEP as needed. Continue working on L side strengthening. Perform TUG test    Rojean JONELLE Batten, PT 06/18/2024, 12:44 PM

## 2024-06-22 NOTE — Therapy (Unsigned)
 OUTPATIENT PHYSICAL THERAPY LOWER EXTREMITY TREATMENT   Patient Name: Shelby Flores MRN: [id number] DOB:December 14, 1945, 78 y.o., female Today's Date: 06/24/2024  END OF SESSION:  PT End of Session - 06/24/24 1233     Visit Number 4    Number of Visits 18    Date for Recertification  08/19/24    Authorization Type MEDICARE PART A AND B    Progress Note Due on Visit 10    PT Start Time 1154    PT Stop Time 1230    PT Time Calculation (min) 36 min    Activity Tolerance Patient tolerated treatment well    Behavior During Therapy [facility] for tasks assessed/performed             Past Medical History:  Diagnosis Date   Allergy    Atypical chest pain 07/18/2015   HTN (hypertension)    Hyperlipidemia    Myofacial muscle pain    FACIAL   Obesity    Personal history of colonic polyps 2009   Repeat colonoscopy 2014   Vasomotor rhinitis    Past Surgical History:  Procedure Laterality Date   ABDOMINAL HYSTERECTOMY  1994   PARTIAL-fibroids   CHOLECYSTECTOMY  2002   GANGLION CYST EXCISION  2002   lt hand   Patient Active Problem List   Diagnosis Date Noted   Insertional Achilles tendinopathy 03/30/2024   History of malignant melanoma of skin 02/17/2024   History of neoplasm 02/17/2024   Inflamed seborrheic keratosis 02/17/2024   SK (seborrheic keratosis) 02/17/2024   Hemangioma of skin and subcutaneous tissue 02/17/2024   Lentigo 02/17/2024   Malignant melanoma of upper limb (HCC) 02/17/2024   Sebaceous cyst 02/17/2024   Acquired trigger finger of left middle finger 12/03/2023   Pain in finger of left hand 12/03/2023   Oromandibular dystonia 02/12/2022   Osteopenia 07/31/2017   Essential tremor 10/18/2014   Vasomotor rhinitis    HTN (hypertension)    Hyperlipidemia    Obesity    Myofascial muscle pain    History of colonic polyps     PCP: Levora Reyes SAUNDERS, MD  REFERRING PROVIDER: Aniceto Eva Grebe, PA-C  REFERRING DIAG: Achilles tendinitis, unspecified leg  [M76.60]   THERAPY DIAG:  Muscle weakness (generalized)  Difficulty in walking, not elsewhere classified  Pain in left ankle and joints of left foot  Rationale for Evaluation and Treatment: Rehabilitation  ONSET DATE: Ongoing for a long time.   SUBJECTIVE:   SUBJECTIVE STATEMENT: I am feeling a lot better, I dont have that pain in my achillis.   PERTINENT HISTORY: HTN, Obesity,  PAIN:  Are you having pain? Yes: NPRS scale: 0-8  Pain location: L achilles at insertion  Pain description: Stabbing pain Aggravating factors: Nothing I can pin point.  Relieving factors: Rest  PRECAUTIONS: None  RED FLAGS: None   WEIGHT BEARING RESTRICTIONS: No  FALLS:  Has patient fallen in last 6 months? No  LIVING ENVIRONMENT: Lives with: lives with their family Lives in: House/apartment Stairs: Yes: External: 1 steps; on right going up Has following equipment at home: None  OCCUPATION: Retired  PLOF: Independent  PATIENT GOALS: Pt would like to be pain free.   NEXT MD VISIT: 08/24/2024  OBJECTIVE:  Note: Objective measures were completed at Evaluation unless otherwise noted.  DIAGNOSTIC FINDINGS: none  PATIENT SURVEYS:  Lower Extremity Functional Score: 61 / 80 = 76.3 %  COGNITION: Overall cognitive status: Within functional limits for tasks assessed     SENSATION: [facility]  POSTURE: No Significant postural limitations  PALPATION: Minimal tenderness present at distal achilles insertion medial.   LOWER EXTREMITY ROM:  Full functional ROM of bilat LE's.   LOWER EXTREMITY MMT:  MMT Right eval Left eval  Hip flexion 5/5 4/5  Hip extension    Hip abduction    Knee flexion 5/5 5/5  Knee extension 5/5 4/5   (Blank rows = not tested)   FUNCTIONAL TESTS:  5 times sit to stand: 09.06sec  Timed up and go (TUG): Next session.   GAIT: Distance walked: 41ft  Assistive device utilized: None Level of assistance: Complete Independence Comments: Trendelenburg gait                                                                                                                                  TREATMENT DATE:  06/24/24 - IASTM to achilles tendon.  - STM to calf - Calf stretch on board - Heel raises on stairs with focus on full range up and down.  - Standing hip extension and abduction - Supine HS and calf stretch   06/18/24 - IASTM to achilles tendon.  - STM to calf - Calf stretch on board - Heel raises on 2 step  - Step up fwd/ lateral to 2 step   06/11/24 - Bridges with cues for full extension on L side . - Supine SLR  - Sidelying clams with RTB - Supine marches with RTB - Supine ball squeezes  - Heel raises - Standing 3 way leg raise - Step ups onto 4 step   Creating, reviewing, and completing below HEP - Bridges with cues for full extension on L side . - Sidelying clams with RTB - Supine SLR  - Standing 3 way leg raise.     PATIENT EDUCATION:  Education details: Educated pt on anatomy and physiology of current symptoms, LEFS, diagnosis, prognosis, HEP,  and POC. Person educated: Patient Education method: Explanation, Demonstration, and Handouts Education comprehension: verbalized understanding and returned demonstration  HOME EXERCISE PROGRAM: Access Code: [email or web address] URL: [email or web address] Date: 05/04/2024 Prepared by: Rojean Batten  Exercises - Supine Bridge  - 1-2 x daily - 7 x weekly - 2-3 sets - 10 reps - Clamshell  - 1-2 x daily - 7 x weekly - 2-3 sets - 10 reps - Seated Long Arc Quad  - 1-2 x daily - 7 x weekly - 2-3 sets - 10 reps - Supine Active Straight Leg Raise  - 1-2 x daily - 7 x weekly - 2-3 sets - 10 reps - Standing Hip Abduction with Counter Support  - 1-2 x daily - 7 x weekly - 2-3 sets - 10 reps - Sit to Stand  - 1-2 x daily - 7 x weekly - 2-3 sets - 10 reps   ASSESSMENT:  CLINICAL IMPRESSION:  Session with focus on scar tissue mobilization at achilles insertion and STM to calf.  Improvements in scar tissue and tissue  extensibility. Challenged her with strengthening exercises for calves and glutes with good tolerance.  Discussed not walking with ER. She will continue to benefit from skilled PT to address continued deficits.    OBJECTIVE IMPAIRMENTS: decreased activity tolerance, difficulty walking, decreased balance, decreased endurance, decreased mobility, decreased ROM, decreased strength, impaired flexibility, impaired UE/LE use, postural dysfunction, and pain.  ACTIVITY LIMITATIONS: bending, lifting, carry, locomotion, cleaning, community activity, driving, and or occupation  PERSONAL FACTORS: HTN, Obesity,  trigeminal neuralgia are also affecting patient's functional outcome.  REHAB POTENTIAL: Good  CLINICAL DECISION MAKING: Stable/uncomplicated  EVALUATION COMPLEXITY: Low    GOALS: Short term PT Goals Target date: 05/18/24 Pt will be I and compliant with HEP. Baseline:  Goal status: New Pt will decrease pain by 25% overall when present  Baseline: Goal status: New  Long term PT goals Target date: 08/19/2024 Pt will improve ROM to [facility] to improve functional mobility Baseline: Goal status: New Pt will improve  hip/knee strength to at least 5-/5 MMT to improve functional strength Baseline: Goal status: New Pt will improve LEFS by at least 9 points  functional to show improved function Baseline: Goal status: New Pt will reduce pain by overall 50% overall with usual activity Baseline: Goal status: New Pt will reduce pain to overall less than 2-3/10 with usual activity and work activity. Baseline: Goal status: New Pt will be able to negotiate stairs with step through gait pattern.  Baseline: Goal status: New  PLAN: PT FREQUENCY: 1-2 times per week   PT DURATION: 6-8 weeks  PLANNED INTERVENTIONS (unless contraindicated): aquatic PT, Canalith repositioning, cryotherapy, Electrical stimulation, Iontophoresis with 4 mg/ml dexamethasome, Moist  heat, traction, Ultrasound, gait training, Therapeutic exercise, balance training, neuromuscular re-education, patient/family education, prosthetic training, manual techniques, passive ROM, dry needling, taping, vasopnuematic device, vestibular, spinal manipulations, joint manipulations  PLAN FOR NEXT SESSION: Update/ progress HEP as needed. Continue working on L side strengthening. Perform TUG test    Rojean JONELLE Batten, PT 06/24/2024, 12:36 PM

## 2024-06-24 ENCOUNTER — Encounter (HOSPITAL_BASED_OUTPATIENT_CLINIC_OR_DEPARTMENT_OTHER): Payer: Self-pay | Admitting: Physical Therapy

## 2024-06-24 ENCOUNTER — Ambulatory Visit (HOSPITAL_BASED_OUTPATIENT_CLINIC_OR_DEPARTMENT_OTHER): Attending: Student | Admitting: Physical Therapy

## 2024-06-24 DIAGNOSIS — M6281 Muscle weakness (generalized): Secondary | ICD-10-CM | POA: Insufficient documentation

## 2024-06-24 DIAGNOSIS — M25572 Pain in left ankle and joints of left foot: Secondary | ICD-10-CM | POA: Insufficient documentation

## 2024-06-24 DIAGNOSIS — R262 Difficulty in walking, not elsewhere classified: Secondary | ICD-10-CM | POA: Insufficient documentation

## 2024-07-08 NOTE — Therapy (Signed)
 OUTPATIENT PHYSICAL THERAPY LOWER EXTREMITY TREATMENT   Patient Name: Shelby Flores MRN: [id number] DOB:14-Sep-1945, 78 y.o., female Today's Date: 07/10/2024  END OF SESSION:  PT End of Session - 07/10/24 1019     Visit Number 5    Number of Visits 18    Date for Recertification  08/19/24    Authorization Type MEDICARE PART A AND B    Progress Note Due on Visit 10    PT Start Time 0930    PT Stop Time 1002    PT Time Calculation (min) 32 min    Activity Tolerance Patient tolerated treatment well    Behavior During Therapy [facility] for tasks assessed/performed              Past Medical History:  Diagnosis Date   Allergy    Atypical chest pain 07/18/2015   HTN (hypertension)    Hyperlipidemia    Myofacial muscle pain    FACIAL   Obesity    Personal history of colonic polyps 2009   Repeat colonoscopy 2014   Vasomotor rhinitis    Past Surgical History:  Procedure Laterality Date   ABDOMINAL HYSTERECTOMY  1994   PARTIAL-fibroids   CHOLECYSTECTOMY  2002   GANGLION CYST EXCISION  2002   lt hand   Patient Active Problem List   Diagnosis Date Noted   Insertional Achilles tendinopathy 03/30/2024   History of malignant melanoma of skin 02/17/2024   History of neoplasm 02/17/2024   Inflamed seborrheic keratosis 02/17/2024   SK (seborrheic keratosis) 02/17/2024   Hemangioma of skin and subcutaneous tissue 02/17/2024   Lentigo 02/17/2024   Malignant melanoma of upper limb (HCC) 02/17/2024   Sebaceous cyst 02/17/2024   Acquired trigger finger of left middle finger 12/03/2023   Pain in finger of left hand 12/03/2023   Oromandibular dystonia 02/12/2022   Osteopenia 07/31/2017   Essential tremor 10/18/2014   Vasomotor rhinitis    HTN (hypertension)    Hyperlipidemia    Obesity    Myofascial muscle pain    History of colonic polyps     PCP: Levora Reyes SAUNDERS, MD  REFERRING PROVIDER: Aniceto Eva Grebe, PA-C  REFERRING DIAG: Achilles tendinitis, unspecified leg  [M76.60]   THERAPY DIAG:  Muscle weakness (generalized)  Difficulty in walking, not elsewhere classified  Pain in left ankle and joints of left foot  Rationale for Evaluation and Treatment: Rehabilitation  ONSET DATE: Ongoing for a long time.   SUBJECTIVE:   SUBJECTIVE STATEMENT: I am feeling a lot better, I dont have that pain in my achillis.   PERTINENT HISTORY: HTN, Obesity,  PAIN:  Are you having pain? Yes: NPRS scale: 0-8  Pain location: L achilles at insertion  Pain description: Stabbing pain Aggravating factors: Nothing I can pin point.  Relieving factors: Rest  PRECAUTIONS: None  RED FLAGS: None   WEIGHT BEARING RESTRICTIONS: No  FALLS:  Has patient fallen in last 6 months? No  LIVING ENVIRONMENT: Lives with: lives with their family Lives in: House/apartment Stairs: Yes: External: 1 steps; on right going up Has following equipment at home: None  OCCUPATION: Retired  PLOF: Independent  PATIENT GOALS: Pt would like to be pain free.   NEXT MD VISIT: 08/24/2024  OBJECTIVE:  Note: Objective measures were completed at Evaluation unless otherwise noted.  DIAGNOSTIC FINDINGS: none  PATIENT SURVEYS:  Lower Extremity Functional Score: 61 / 80 = 76.3 %  COGNITION: Overall cognitive status: Within functional limits for tasks assessed     SENSATION:  WFL  POSTURE: No Significant postural limitations  PALPATION: Minimal tenderness present at distal achilles insertion medial.   LOWER EXTREMITY ROM:  Full functional ROM of bilat LE's.   LOWER EXTREMITY MMT:  MMT Right eval Left eval  Hip flexion 5/5 4/5  Hip extension    Hip abduction    Knee flexion 5/5 5/5  Knee extension 5/5 4/5   (Blank rows = not tested)   FUNCTIONAL TESTS:  5 times sit to stand: 09.06sec  Timed up and go (TUG): Next session.   GAIT: Distance walked: 62ft  Assistive device utilized: None Level of assistance: Complete Independence Comments: Trendelenburg gait                                                                                                                                  TREATMENT DATE:  07/10/24 Shuttle 75#, 2x10, SL 50# 2x10 Heel raises on 2 step  Standing hip extension 2x10, bilat  Standing hip abduction 2x10, bilat  RTB around knees, sidestepping, 4 laps Step up 6 step 2x10, bilat Step down form 6 step 1x10, bilat  06/24/24 - IASTM to achilles tendon.  - STM to calf - Calf stretch on board - Heel raises on stairs with focus on full range up and down.  - Standing hip extension and abduction - Supine HS and calf stretch   06/18/24 - IASTM to achilles tendon.  - STM to calf - Calf stretch on board - Heel raises on 2 step  - Step up fwd/ lateral to 2 step   06/11/24 - Bridges with cues for full extension on L side . - Supine SLR  - Sidelying clams with RTB - Supine marches with RTB - Supine ball squeezes  - Heel raises - Standing 3 way leg raise - Step ups onto 4 step   Creating, reviewing, and completing below HEP - Bridges with cues for full extension on L side . - Sidelying clams with RTB - Supine SLR  - Standing 3 way leg raise.     PATIENT EDUCATION:  Education details: Educated pt on anatomy and physiology of current symptoms, LEFS, diagnosis, prognosis, HEP,  and POC. Person educated: Patient Education method: Explanation, Demonstration, and Handouts Education comprehension: verbalized understanding and returned demonstration  HOME EXERCISE PROGRAM: Access Code: [email or web address] URL: [email or web address] Date: 05/04/2024 Prepared by: Rojean Batten  Exercises - Supine Bridge  - 1-2 x daily - 7 x weekly - 2-3 sets - 10 reps - Clamshell  - 1-2 x daily - 7 x weekly - 2-3 sets - 10 reps - Seated Long Arc Quad  - 1-2 x daily - 7 x weekly - 2-3 sets - 10 reps - Supine Active Straight Leg Raise  - 1-2 x daily - 7 x weekly - 2-3 sets - 10 reps - Standing Hip Abduction with Counter  Support  - 1-2 x daily - 7 x weekly - 2-3 sets - 10  reps - Sit to Stand  - 1-2 x daily - 7 x weekly - 2-3 sets - 10 reps   ASSESSMENT:  CLINICAL IMPRESSION:  Pt arrives with no pain. She denies any difficulties but reports that she goes up/ down stairs with step to gait pattern. Pt with 0-4 pain overall. She feels as though she Is ready for discharge today.  Pt demonstrates functional weakness on her L LE > R. She has secondary limitations due to ROM deficits on L achilles due to history of trauma. Pt was provided and updated HEP and discharged from PT at this time. Please see goals at bottom of note. Pt is in agreement with this plan.    OBJECTIVE IMPAIRMENTS: decreased activity tolerance, difficulty walking, decreased balance, decreased endurance, decreased mobility, decreased ROM, decreased strength, impaired flexibility, impaired UE/LE use, postural dysfunction, and pain.  ACTIVITY LIMITATIONS: bending, lifting, carry, locomotion, cleaning, community activity, driving, and or occupation  PERSONAL FACTORS: HTN, Obesity,  trigeminal neuralgia are also affecting patient's functional outcome.  REHAB POTENTIAL: Good  CLINICAL DECISION MAKING: Stable/uncomplicated  EVALUATION COMPLEXITY: Low    GOALS: Short term PT Goals Target date: 05/18/24 Pt will be I and compliant with HEP. Baseline:  Goal status: MET 07/10/24 Pt will decrease pain by 25% overall when present  Baseline: Goal status: 07/10/24  Long term PT goals Target date: 08/19/2024 Pt will improve ROM to [facility] to improve functional mobility Baseline: Goal status: 07/10/24 Pt will improve  hip/knee strength to at least 5-/5 MMT to improve functional strength Baseline: Goal status: New Pt will improve LEFS by at least 9 points  functional to show improved function Baseline: Goal status: New Pt will reduce pain by overall 50% overall with usual activity Baseline: Goal status: 07/10/24 Pt will reduce pain to overall less  than 2-3/10 with usual activity and work activity. Baseline: Goal status: 07/10/24 Pt will be able to negotiate stairs with step through gait pattern.  Baseline: Goal status: 07/10/24   Rojean JONELLE Batten, PT 07/10/2024, 10:21 AM

## 2024-07-10 ENCOUNTER — Encounter (HOSPITAL_BASED_OUTPATIENT_CLINIC_OR_DEPARTMENT_OTHER): Payer: Self-pay | Admitting: Physical Therapy

## 2024-07-10 ENCOUNTER — Ambulatory Visit (HOSPITAL_BASED_OUTPATIENT_CLINIC_OR_DEPARTMENT_OTHER): Payer: Self-pay | Admitting: Physical Therapy

## 2024-07-10 DIAGNOSIS — M6281 Muscle weakness (generalized): Secondary | ICD-10-CM

## 2024-07-10 DIAGNOSIS — R262 Difficulty in walking, not elsewhere classified: Secondary | ICD-10-CM

## 2024-07-10 DIAGNOSIS — M25572 Pain in left ankle and joints of left foot: Secondary | ICD-10-CM

## 2024-07-20 ENCOUNTER — Ambulatory Visit: Attending: Cardiology | Admitting: Cardiovascular Disease

## 2024-07-20 ENCOUNTER — Encounter: Payer: Self-pay | Admitting: Cardiovascular Disease

## 2024-07-20 VITALS — BP 126/74 | HR 67 | Ht 64.0 in | Wt 196.0 lb

## 2024-07-20 DIAGNOSIS — E782 Mixed hyperlipidemia: Secondary | ICD-10-CM | POA: Diagnosis present

## 2024-07-20 DIAGNOSIS — E669 Obesity, unspecified: Secondary | ICD-10-CM | POA: Insufficient documentation

## 2024-07-20 DIAGNOSIS — I444 Left anterior fascicular block: Secondary | ICD-10-CM | POA: Diagnosis not present

## 2024-07-20 DIAGNOSIS — I10 Essential (primary) hypertension: Secondary | ICD-10-CM | POA: Insufficient documentation

## 2024-07-20 DIAGNOSIS — R7303 Prediabetes: Secondary | ICD-10-CM | POA: Diagnosis not present

## 2024-07-20 NOTE — Patient Instructions (Signed)
 Medication Instructions:  No changes *If you need a refill on your cardiac medications before your next appointment, please call your pharmacy*  Lab Work: None ordered If you have labs (blood work) drawn today and your tests are completely normal, you will receive your results only by: MyChart Message (if you have MyChart) OR A paper copy in the mail If you have any lab test that is abnormal or we need to change your treatment, we will call you to review the results.  Testing/Procedures: None ordered  Follow-Up: At [facility], you and your health needs are our priority.  As part of our continuing mission to provide you with exceptional heart care, our providers are all part of one team.  This team includes your primary Cardiologist (physician) and Advanced Practice Providers or APPs (Physician Assistants and Nurse Practitioners) who all work together to provide you with the care you need, when you need it.  Your next appointment:    Follow up as needed  Provider:   Dr Francyne  We recommend signing up for the patient portal called MyChart.  Sign up information is provided on this After Visit Summary.  MyChart is used to connect with patients for Virtual Visits (Telemedicine).  Patients are able to view lab/test results, encounter notes, upcoming appointments, etc.  Non-urgent messages can be sent to your provider as well.   To learn more about what you can do with MyChart, go to [email or web address].

## 2024-07-21 DIAGNOSIS — R7303 Prediabetes: Secondary | ICD-10-CM | POA: Insufficient documentation

## 2024-07-21 DIAGNOSIS — I444 Left anterior fascicular block: Secondary | ICD-10-CM | POA: Insufficient documentation

## 2024-07-21 NOTE — Progress Notes (Signed)
 Cardiology Office Note:    Date:  07/21/2024   ID:  Shelby Flores, Shelby Flores [birth year], MRN [id number]  PCP:  Levora Reyes SAUNDERS, MD   [facility] Providers Cardiologist:  None     Referring MD: Levora Reyes SAUNDERS, MD   Chief Complaint  Patient presents with   Consult  Shelby Flores is a 78 y.o. female who is being seen today for the evaluation of abnormal electrocardiogram at the request of Levora Reyes SAUNDERS, MD.   History of Present Illness:    Shelby Flores is a 78 y.o. female with a hx of HTN, HLP/prediabetes, moderate obesity, referred in consultation for an abnormal electrocardiogram.  The electrocardiogram was performed during a routine entrance physical exam for a weight loss program.  Shows normal sinus rhythm with left anterior fascicular block.  She exercises regularly at [facility], doing the deep water aerobics.  She has no problems with shortness of breath or chest pain doing this.  She experiences fatigue when climbing stairs but denies shortness of breath or chest pain with other activities of daily living. She has experienced chest discomfort in the past, which she attributes to stress or possibly heartburn, as it was not associated with physical exertion and resolved spontaneously. She has not experienced this discomfort in the past five to six weeks.  She denies symptoms of daytime hypersomnolence.  Her metabolic parameters are reasonably good with hemoglobin A1c of 6.0%, HDL 52, LDL 78, borderline triglycerides 163 and normal renal function parameters.  She has a history of similar EKG findings dating back to 2016, when extensive cardiac testing, including a nuclear stress test and an echocardiogram, showed no evidence of a heart attack and normal heart function, except for mild left ventricular hypertrophy and impaired relaxation pattern. The current EKG findings are identical to those from 2016.  She has not had a lot of imaging studies of the chest to look for  coronary calcification, but there are no incidental reports of vascular calcifications on her mammograms  She has been friends (ever since second grade) with Lynell Purpura who is also my patient.  Past Medical History:  Diagnosis Date   Allergy    Atypical chest pain 07/18/2015   HTN (hypertension)    Hyperlipidemia    Myofacial muscle pain    FACIAL   Obesity    Personal history of colonic polyps 2009   Repeat colonoscopy 2014   Vasomotor rhinitis     Past Surgical History:  Procedure Laterality Date   ABDOMINAL HYSTERECTOMY  1994   PARTIAL-fibroids   CHOLECYSTECTOMY  2002   GANGLION CYST EXCISION  2002   lt hand    Current Medications: Current Meds  Medication Sig   benazepril  (LOTENSIN ) 10 MG tablet Take 1 tablet (10 mg total) by mouth daily.   Multiple Vitamin (MULTIVITAMIN) tablet Take 1 tablet by mouth daily.   Multiple Vitamins-Minerals (EYE HEALTH AREDS 2 PO) Take 1 capsule by mouth 2 (two) times daily.   simvastatin  (ZOCOR ) 10 MG tablet Take 1 tablet (10 mg total) by mouth at bedtime.     Allergies:   Patient has no known allergies.   Social History   Socioeconomic History   Marital status: Married    Spouse name: Not on file   Number of children: Not on file   Years of education: Not on file   Highest education level: Professional school degree (e.g., MD, DDS, DVM, JD)  Occupational History   Not on file  Tobacco Use   Smoking status: Never   Smokeless tobacco: Never  Vaping Use   Vaping status: Never Used  Substance and Sexual Activity   Alcohol use: Yes    Alcohol/week: 4.0 standard drinks of alcohol    Types: 4 Standard drinks or equivalent per week    Comment: wine   Drug use: No   Sexual activity: Yes    Birth control/protection: Surgical  Other Topics Concern   Not on file  Social History Narrative   Epworth Sleepiness Scale = 8 (as of 07/18/15)   Social Drivers of Health   Financial Resource Strain: Low Risk  (02/14/2024)   Overall  Financial Resource Strain (CARDIA)    Difficulty of Paying Living Expenses: Not hard at all  Food Insecurity: No Food Insecurity (02/14/2024)   Hunger Vital Sign    Worried About Running Out of Food in the Last Year: Never true    Ran Out of Food in the Last Year: Never true  Transportation Needs: No Transportation Needs (02/14/2024)   PRAPARE - Administrator, Civil Service (Medical): No    Lack of Transportation (Non-Medical): No  Physical Activity: Insufficiently Active (02/14/2024)   Exercise Vital Sign    Days of Exercise per Week: 3 days    Minutes of Exercise per Session: 40 min  Stress: Stress Concern Present (02/14/2024)   [employer] of Occupational Health - Occupational Stress Questionnaire    Feeling of Stress: Rather much  Social Connections: Moderately Integrated (02/14/2024)   Social Connection and Isolation Panel    Frequency of Communication with Friends and Family: Three times a week    Frequency of Social Gatherings with Friends and Family: Once a week    Attends Religious Services: Never    Database Administrator or Organizations: Yes    Attends Engineer, Structural: More than 4 times per year    Marital Status: Married     Family History: The patient's family history includes Atrial fibrillation in her father; Cancer in her maternal grandfather and maternal grandmother; Colon cancer in her maternal grandfather; Colon polyps in her brother and father; Dystonia in her mother; Heart failure in her father; Hypertension in her father; Lung cancer in her sister; Stroke in her paternal grandfather.  ROS:   Please see the history of present illness.     All other systems reviewed and are negative.  EKGs/Labs/Other Studies Reviewed:    The following studies were reviewed today: Multiple ECGs over the years, echocardiogram and nuclear stress test from 2016      Recent Labs: 02/17/2024: ALT 24; BUN 18; Creatinine, Ser 0.69; Potassium 4.7; Sodium  136  Recent Lipid Panel    Component Value Date/Time   CHOL 163 02/17/2024 1427   CHOL 273 (H) 07/12/2020 1138   TRIG 163.0 (H) 02/17/2024 1427   HDL 52.00 02/17/2024 1427   HDL 54 07/12/2020 1138   CHOLHDL 3 02/17/2024 1427   VLDL 32.6 02/17/2024 1427   LDLCALC 78 02/17/2024 1427   LDLCALC 166 (H) 07/12/2020 1138   LDLDIRECT 156.0 01/04/2021 1215     Risk Assessment/Calculations:                Physical Exam:    VS:  BP 126/74 (BP Location: Left Arm, Patient Position: Sitting, Cuff Size: Large)   Pulse 67   Ht 5' 4 (1.626 m)   Wt 196 lb (88.9 kg)   SpO2 95%   BMI 33.64 kg/m  Wt Readings from Last 3 Encounters:  07/20/24 196 lb (88.9 kg)  06/12/24 200 lb 3.2 oz (90.8 kg)  05/25/24 198 lb (89.8 kg)     GEN: Moderately obese well nourished, well developed in no acute distress HEENT: Normal NECK: No JVD; No carotid bruits LYMPHATICS: No lymphadenopathy CARDIAC: RRR, no murmurs, rubs, gallops RESPIRATORY:  Clear to auscultation without rales, wheezing or rhonchi  ABDOMEN: Soft, non-tender, non-distended MUSCULOSKELETAL:  No edema; No deformity  SKIN: Warm and dry NEUROLOGIC:  Alert and oriented x 3 PSYCHIATRIC:  Normal affect   ASSESSMENT:    1. Left anterior fascicular block   2. Mixed hyperlipidemia   3. Prediabetes   4. Moderate obesity   5. Essential hypertension    PLAN:    In order of problems listed above:  Abnormal ECG: Her left anterior fascicular block has been present for at least the last 9 years.  In 2016 evaluation for this abnormality included an echocardiogram and a nuclear perfusion study, both of which had reassuring low risk findings.  I do not think we need to repeat these to assess, especially since she is essentially asymptomatic. preDM/HLP/obesity: Borderline elevated hemoglobin A1c and triglycerides with otherwise good lipid parameters on simvastatin .  I do not think we need to adjust her medications, but would focus on weight  loss (as she is already doing), regular exercise, reducing the intake of carbohydrates, especially sugars and starches with high glycemic index.  Replace these with more complex carbohydrates from whole grains, lean protein, unsaturated fats from fish/nuts/oils. HTN: Well-controlled on benazepril , continue.           Medication Adjustments/Labs and Tests Ordered: Current medicines are reviewed at length with the patient today.  Concerns regarding medicines are outlined above.  No orders of the defined types were placed in this encounter.  No orders of the defined types were placed in this encounter.   Patient Instructions  Medication Instructions:  No changes *If you need a refill on your cardiac medications before your next appointment, please call your pharmacy*  Lab Work: None ordered If you have labs (blood work) drawn today and your tests are completely normal, you will receive your results only by: MyChart Message (if you have MyChart) OR A paper copy in the mail If you have any lab test that is abnormal or we need to change your treatment, we will call you to review the results.  Testing/Procedures: None ordered  Follow-Up: At [facility], you and your health needs are our priority.  As part of our continuing mission to provide you with exceptional heart care, our providers are all part of one team.  This team includes your primary Cardiologist (physician) and Advanced Practice Providers or APPs (Physician Assistants and Nurse Practitioners) who all work together to provide you with the care you need, when you need it.  Your next appointment:    Follow up as needed  Provider:   Dr Francyne  We recommend signing up for the patient portal called MyChart.  Sign up information is provided on this After Visit Summary.  MyChart is used to connect with patients for Virtual Visits (Telemedicine).  Patients are able to view lab/test results, encounter notes, upcoming  appointments, etc.  Non-urgent messages can be sent to your provider as well.   To learn more about what you can do with MyChart, go to [email or web address].      Signed, Jerel Francyne, MD  07/21/2024 3:44 PM    [facility]

## 2024-07-30 ENCOUNTER — Encounter: Payer: Self-pay | Admitting: Family Medicine

## 2024-07-30 ENCOUNTER — Ambulatory Visit (INDEPENDENT_AMBULATORY_CARE_PROVIDER_SITE_OTHER): Admitting: Family Medicine

## 2024-07-30 VITALS — BP 146/86 | HR 85 | Temp 98.8°F | Resp 18 | Ht 64.0 in | Wt 191.2 lb

## 2024-07-30 DIAGNOSIS — R350 Frequency of micturition: Secondary | ICD-10-CM

## 2024-07-30 DIAGNOSIS — N390 Urinary tract infection, site not specified: Secondary | ICD-10-CM | POA: Diagnosis not present

## 2024-07-30 DIAGNOSIS — R319 Hematuria, unspecified: Secondary | ICD-10-CM | POA: Diagnosis not present

## 2024-07-30 LAB — POCT URINALYSIS DIPSTICK
Appearance: NORMAL
Bilirubin, UA: NEGATIVE
Blood, UA: 200
Glucose, UA: NEGATIVE
Ketones, UA: NEGATIVE
Nitrite, UA: NEGATIVE
Protein, UA: NEGATIVE
Spec Grav, UA: 1.025 (ref 1.010–1.025)
Urobilinogen, UA: 0.2 U/dL
pH, UA: 6 (ref 5.0–8.0)

## 2024-07-30 MED ORDER — CEPHALEXIN 500 MG PO CAPS: 500.0000 mg | ORAL_CAPSULE | Freq: Two times a day (BID) | ORAL | 0 refills | Status: AC

## 2024-07-30 NOTE — Progress Notes (Signed)
 Subjective:  Patient ID: Shelby Flores, female    DOB: 1945-12-02  Age: 78 y.o. MRN: [id number]  CC:  Chief Complaint  Patient presents with   Acute Visit    Sx started 4-5 days ago. Urine was cloudy. Frequency. Burning. Going to [employer].     HPI Shelby Flores presents for  Acute visit for  Dysuria Urinary frequency, burning with urination, urgency, cloudy urine started approximately 4 to 5 days ago. Denies fevers, abdominal pain, back pain, confusion.   Attempted treatments: organic cranberry juice. Less cloudy, but slight burning persists, and urgency.  Beach trip to [place].     E. coli UTI in November 2024. Tolerated keflex  500mg BID at that time.    History Patient Active Problem List   Diagnosis Date Noted   Prediabetes 07/21/2024   Left anterior fascicular block 07/21/2024   Carpal tunnel syndrome of left wrist 07/08/2024   Insertional Achilles tendinopathy 03/30/2024   History of malignant melanoma of skin 02/17/2024   History of neoplasm 02/17/2024   Inflamed seborrheic keratosis 02/17/2024   SK (seborrheic keratosis) 02/17/2024   Hemangioma of skin and subcutaneous tissue 02/17/2024   Lentigo 02/17/2024   Malignant melanoma of upper limb (HCC) 02/17/2024   Sebaceous cyst 02/17/2024   Acquired trigger finger of left middle finger 12/03/2023   Pain in finger of left hand 12/03/2023   Oromandibular dystonia 02/12/2022   Osteopenia 07/31/2017   Essential tremor 10/18/2014   Vasomotor rhinitis    HTN (hypertension)    Hyperlipidemia    Obesity    Myofascial muscle pain    History of colonic polyps    Past Medical History:  Diagnosis Date   Allergy    Atypical chest pain 07/18/2015   HTN (hypertension)    Hyperlipidemia    Myofacial muscle pain    FACIAL   Obesity    Personal history of colonic polyps 2009   Repeat colonoscopy 2014   Vasomotor rhinitis    Past Surgical History:  Procedure Laterality Date   ABDOMINAL  HYSTERECTOMY  1994   PARTIAL-fibroids   CHOLECYSTECTOMY  2002   GANGLION CYST EXCISION  2002   lt hand   Allergies[1] Prior to Admission medications  Medication Sig Start Date End Date Taking? Authorizing Provider  Acetaminophen (TYLENOL) 325 MG CAPS Take 2 capsules by mouth 3 (three) times daily. Or Ibuprofen   Yes [provider]  benazepril  (LOTENSIN ) 10 MG tablet Take 1 tablet (10 mg total) by mouth daily. 02/17/24  Yes Levora Reyes SAUNDERS, MD  Multiple Vitamins-Minerals (EYE HEALTH AREDS 2 PO) Take 1 capsule by mouth 2 (two) times daily.   Yes [provider]  Naproxen  Sodium (ALEVE ) 220 MG CAPS Take 1 capsule by mouth daily. Patient taking differently: Take 1 capsule by mouth 2 (two) times daily. 1-2 times a day   Yes [provider]  simvastatin  (ZOCOR ) 10 MG tablet Take 1 tablet (10 mg total) by mouth at bedtime. 02/17/24  Yes Levora Reyes SAUNDERS, MD  TIRZEPATIDE-WEIGHT MANAGEMENT [employer] Inject into the skin once a week.   Yes [provider]  Multiple Vitamin (MULTIVITAMIN) tablet Take 1 tablet by mouth daily. Patient not taking: Reported on 07/30/2024    [provider]   Social History   Socioeconomic History   Marital status: Married    Spouse name: Not on file   Number of children: Not on file   Years of education: Not on file   Highest education  level: Professional school degree (e.g., MD, DDS, DVM, JD)  Occupational History   Not on file  Tobacco Use   Smoking status: Never   Smokeless tobacco: Never  Vaping Use   Vaping status: Never Used  Substance and Sexual Activity   Alcohol use: Yes    Alcohol/week: 4.0 standard drinks of alcohol    Types: 4 Standard drinks or equivalent per week    Comment: wine   Drug use: No   Sexual activity: Yes    Birth control/protection: Surgical  Other Topics Concern   Not on file  Social History Narrative   Epworth Sleepiness Scale = 8 (as of 07/18/15)   Social Drivers of Health    Tobacco Use: Low Risk (07/20/2024)   Patient History    Smoking Tobacco Use: Never    Smokeless Tobacco Use: Never    Passive Exposure: Not on file  Financial Resource Strain: Low Risk (02/14/2024)   Overall Financial Resource Strain (CARDIA)    Difficulty of Paying Living Expenses: Not hard at all  Food Insecurity: No Food Insecurity (02/14/2024)   Epic    Worried About Radiation Protection Practitioner of Food in the Last Year: Never true    Ran Out of Food in the Last Year: Never true  Transportation Needs: No Transportation Needs (02/14/2024)   Epic    Lack of Transportation (Medical): No    Lack of Transportation (Non-Medical): No  Physical Activity: Insufficiently Active (02/14/2024)   Exercise Vital Sign    Days of Exercise per Week: 3 days    Minutes of Exercise per Session: 40 min  Stress: Stress Concern Present (02/14/2024)   [employer] of Occupational Health - Occupational Stress Questionnaire    Feeling of Stress: Rather much  Social Connections: Moderately Integrated (02/14/2024)   Social Connection and Isolation Panel    Frequency of Communication with Friends and Family: Three times a week    Frequency of Social Gatherings with Friends and Family: Once a week    Attends Religious Services: Never    Database Administrator or Organizations: Yes    Attends Engineer, Structural: More than 4 times per year    Marital Status: Married  Catering Manager Violence: Not At Risk (07/11/2023)   Humiliation, Afraid, Rape, and Kick questionnaire    Fear of Current or Ex-Partner: No    Emotionally Abused: No    Physically Abused: No    Sexually Abused: No  Depression (PHQ2-9): Low Risk (07/11/2023)   Depression (PHQ2-9)    PHQ-2 Score: 1  Alcohol Screen: Low Risk (02/14/2024)   Alcohol Screen    Last Alcohol Screening Score (AUDIT): 3  Housing: Low Risk (02/14/2024)   Epic    Unable to Pay for Housing in the Last Year: No    Number of Times Moved in the Last Year: 0    Homeless in  the Last Year: No  Utilities: Not At Risk (07/11/2023)   AHC Utilities    Threatened with loss of utilities: No  Health Literacy: Adequate Health Literacy (07/11/2023)   B1300 Health Literacy    Frequency of need for help with medical instructions: Never    Review of Systems Per HPI.   Objective:   Vitals:   07/30/24 1423  BP: (!) 146/86  Pulse: 85  Resp: 18  Temp: 98.8 F (37.1 C)  TempSrc: Temporal  SpO2: 97%  Weight: 191 lb 3.2 oz (86.7 kg)  Height: 5' 4 (1.626 m)     Physical  Exam Constitutional:      Appearance: Normal appearance. She is well-developed.  HENT:     Head: Normocephalic and atraumatic.  Pulmonary:     Effort: Pulmonary effort is normal.  Abdominal:     General: There is no distension.     Palpations: Abdomen is soft.     Tenderness: There is no abdominal tenderness. There is no right CVA tenderness, left CVA tenderness, guarding or rebound.  Skin:    General: Skin is warm.  Neurological:     Mental Status: She is alert and oriented to person, place, and time.  Psychiatric:        Behavior: Behavior normal.      Results for orders placed or performed in visit on 07/30/24  POCT Urinalysis Dipstick   Collection Time: 07/30/24  2:32 PM  Result Value Ref Range   Color, UA yellow    Clarity, UA clear    Glucose, UA Negative Negative   Bilirubin, UA Negative    Ketones, UA Negative    Spec Grav, UA 1.025 1.010 - 1.025   Blood, UA 200    pH, UA 6.0 5.0 - 8.0   Protein, UA Negative Negative   Urobilinogen, UA 0.2 0.2 or 1.0 E.U./dL   Nitrite, UA Negative    Leukocytes, UA Trace (A) Negative   Appearance normal    Odor none      Assessment & Plan:  Shelby Flores is a 78 y.o. female . Urinary frequency - Plan: POCT Urinalysis Dipstick, Urine Culture, cephALEXin  (KEFLEX ) 500 MG capsule  Urinary tract infection with hematuria, site unspecified - Plan: Urine Culture, cephALEXin  (KEFLEX ) 500 MG capsule Typical symptoms of UTI with trace  LE.  Check urine culture.  Start Keflex , has tolerated previously.  Repeat urinalysis at next visit to document clearing of hematuria.  Handout given, symptomatic care discussed, RTC precautions given.  Meds ordered this encounter  Medications   cephALEXin  (KEFLEX ) 500 MG capsule    Sig: Take 1 capsule (500 mg total) by mouth 2 (two) times daily.    Dispense:  10 capsule    Refill:  0   Patient Instructions  Thank you for coming in today.  It does appear that you likely have a urinary tract infection, especially with your current symptoms.  Start cephalexin  twice per day, Azo (Pyridium) over-the-counter is an option temporarily if needed.  I expect symptoms to improve quickly but be seen if any new or worsening symptoms.  We can recheck the urinalysis in January to make sure the blood has cleared which is likely due to this infection.  Take care and have a great trip to the beach.  Urinary Tract Infection, Female A urinary tract infection (UTI) is an infection in your urinary tract. The urinary tract is made up of organs that make, store, and get rid of pee (urine) in your body. These organs include: The kidneys. The ureters. The bladder. The urethra. What are the causes? Most UTIs are caused by germs called bacteria. They may be in or near your genitals. These germs grow and cause swelling in your urinary tract. What increases the risk? You're more likely to get a UTI if: You're a female. The urethra is shorter in females than in males. You have a soft tube called a catheter that drains your pee. You can't control when you pee or poop. You have trouble peeing because of: A kidney stone. A urinary blockage. A nerve condition that affects your bladder. Not  getting enough to drink. You're sexually active. You use a birth control inside your vagina, like spermicide. You're pregnant. You have low levels of the hormone estrogen in your body. You're an older adult. You're also more  likely to get a UTI if you have other health problems. These may include: Diabetes. A weak immune system. Your immune system is your body's defense system. Sickle cell disease. Injury of the spine. What are the signs or symptoms? Symptoms may include: Needing to pee right away. Peeing small amounts often. Pain or burning when you pee. Blood in your pee. Pee that smells bad or odd. Pain in your belly or lower back. You may also: Feel confused. This may be the first symptom in older adults. Vomit. Not feel hungry. Feel tired or easily annoyed. Have a fever or chills. How is this diagnosed? A UTI is diagnosed based on your medical history and an exam. You may also have other tests. These may include: Pee tests. Blood tests. Tests for sexually transmitted infections (STIs). If you've had more than one UTI, you may need to have imaging studies done to find out why you keep getting them. How is this treated? A UTI can be treated by: Taking antibiotics or other medicines. Drinking enough fluid to keep your pee pale yellow. In rare cases, a UTI can cause a very bad condition called sepsis. Sepsis may be treated in the hospital. Follow these instructions at home: Medicines Take your medicines only as told by your health care provider. If you were given antibiotics, take them as told by your provider. Do not stop taking them even if you start to feel better. General instructions Make sure you: Pee often and fully. Do not hold your pee for a long time. Wipe from front to back after you pee or poop. Use each tissue only once when you wipe. Pee after you have sex. Do not douche or use sprays or powders in your genital area. Contact a health care provider if: Your symptoms don't get better after 1-2 days of taking antibiotics. Your symptoms go away and then come back. You have a fever or chills. You vomit or feel like you may vomit. Get help right away if: You have very bad pain in  your back or lower belly. You faint. This information is not intended to replace advice given to you by your health care provider. Make sure you discuss any questions you have with your health care provider. Document Revised: 07/17/2023 Document Reviewed: 11/09/2022 Elsevier Patient Education  2025 [employer].    Signed,   Reyes Pines, MD [facility], [facility] 07/30/2024 2:54 PM      [1] No Known Allergies

## 2024-07-30 NOTE — Patient Instructions (Signed)
 Thank you for coming in today.  It does appear that you likely have a urinary tract infection, especially with your current symptoms.  Start cephalexin  twice per day, Azo (Pyridium) over-the-counter is an option temporarily if needed.  I expect symptoms to improve quickly but be seen if any new or worsening symptoms.  We can recheck the urinalysis in January to make sure the blood has cleared which is likely due to this infection.  Take care and have a great trip to the beach.  Urinary Tract Infection, Female A urinary tract infection (UTI) is an infection in your urinary tract. The urinary tract is made up of organs that make, store, and get rid of pee (urine) in your body. These organs include: The kidneys. The ureters. The bladder. The urethra. What are the causes? Most UTIs are caused by germs called bacteria. They may be in or near your genitals. These germs grow and cause swelling in your urinary tract. What increases the risk? You're more likely to get a UTI if: You're a female. The urethra is shorter in females than in males. You have a soft tube called a catheter that drains your pee. You can't control when you pee or poop. You have trouble peeing because of: A kidney stone. A urinary blockage. A nerve condition that affects your bladder. Not getting enough to drink. You're sexually active. You use a birth control inside your vagina, like spermicide. You're pregnant. You have low levels of the hormone estrogen in your body. You're an older adult. You're also more likely to get a UTI if you have other health problems. These may include: Diabetes. A weak immune system. Your immune system is your body's defense system. Sickle cell disease. Injury of the spine. What are the signs or symptoms? Symptoms may include: Needing to pee right away. Peeing small amounts often. Pain or burning when you pee. Blood in your pee. Pee that smells bad or odd. Pain in your belly or lower  back. You may also: Feel confused. This may be the first symptom in older adults. Vomit. Not feel hungry. Feel tired or easily annoyed. Have a fever or chills. How is this diagnosed? A UTI is diagnosed based on your medical history and an exam. You may also have other tests. These may include: Pee tests. Blood tests. Tests for sexually transmitted infections (STIs). If you've had more than one UTI, you may need to have imaging studies done to find out why you keep getting them. How is this treated? A UTI can be treated by: Taking antibiotics or other medicines. Drinking enough fluid to keep your pee pale yellow. In rare cases, a UTI can cause a very bad condition called sepsis. Sepsis may be treated in the hospital. Follow these instructions at home: Medicines Take your medicines only as told by your health care provider. If you were given antibiotics, take them as told by your provider. Do not stop taking them even if you start to feel better. General instructions Make sure you: Pee often and fully. Do not hold your pee for a long time. Wipe from front to back after you pee or poop. Use each tissue only once when you wipe. Pee after you have sex. Do not douche or use sprays or powders in your genital area. Contact a health care provider if: Your symptoms don't get better after 1-2 days of taking antibiotics. Your symptoms go away and then come back. You have a fever or chills. You vomit or  feel like you may vomit. Get help right away if: You have very bad pain in your back or lower belly. You faint. This information is not intended to replace advice given to you by your health care provider. Make sure you discuss any questions you have with your health care provider. Document Revised: 07/17/2023 Document Reviewed: 11/09/2022 [employer] Patient Education  2025 [employer].

## 2024-08-01 LAB — URINE CULTURE
MICRO NUMBER:: 17343931
SPECIMEN QUALITY:: ADEQUATE

## 2024-08-04 ENCOUNTER — Ambulatory Visit: Payer: Self-pay | Admitting: Family Medicine

## 2024-08-24 ENCOUNTER — Ambulatory Visit: Admitting: Family Medicine

## 2024-09-28 ENCOUNTER — Ambulatory Visit: Admitting: Family Medicine

## 2024-10-12 ENCOUNTER — Ambulatory Visit: Admitting: Family Medicine
